# Patient Record
Sex: Male | Born: 1965 | Race: White | Hispanic: No | Marital: Married | Smoking: Never smoker
Health system: Southern US, Community
[De-identification: ages and names within clinical notes are randomized; demographics above are authoritative.]

## PROBLEM LIST (undated history)

## (undated) DIAGNOSIS — G4733 Obstructive sleep apnea (adult) (pediatric): Secondary | ICD-10-CM

## (undated) DIAGNOSIS — E781 Pure hyperglyceridemia: Secondary | ICD-10-CM

## (undated) DIAGNOSIS — I4891 Unspecified atrial fibrillation: Secondary | ICD-10-CM

## (undated) DIAGNOSIS — F419 Anxiety disorder, unspecified: Secondary | ICD-10-CM

## (undated) HISTORY — DX: Unspecified atrial fibrillation: I48.91

## (undated) HISTORY — PX: OTHER SURGICAL HISTORY: SHX169

## (undated) HISTORY — DX: Pure hyperglyceridemia: E78.1

## (undated) HISTORY — DX: Obstructive sleep apnea (adult) (pediatric): G47.33

## (undated) HISTORY — DX: Anxiety disorder, unspecified: F41.9

---

## 2005-06-07 ENCOUNTER — Encounter: Admission: RE | Admit: 2005-06-07 | Discharge: 2005-06-07 | Payer: Self-pay | Admitting: Family Medicine

## 2013-08-19 ENCOUNTER — Encounter: Payer: Self-pay | Admitting: *Deleted

## 2013-08-19 ENCOUNTER — Encounter: Payer: Self-pay | Admitting: Cardiology

## 2013-08-19 DIAGNOSIS — E781 Pure hyperglyceridemia: Secondary | ICD-10-CM | POA: Insufficient documentation

## 2013-08-19 DIAGNOSIS — F419 Anxiety disorder, unspecified: Secondary | ICD-10-CM | POA: Insufficient documentation

## 2013-08-19 DIAGNOSIS — G4733 Obstructive sleep apnea (adult) (pediatric): Secondary | ICD-10-CM | POA: Insufficient documentation

## 2013-08-19 DIAGNOSIS — I4891 Unspecified atrial fibrillation: Secondary | ICD-10-CM | POA: Insufficient documentation

## 2013-08-20 ENCOUNTER — Encounter: Payer: Self-pay | Admitting: Cardiology

## 2013-08-20 ENCOUNTER — Ambulatory Visit (INDEPENDENT_AMBULATORY_CARE_PROVIDER_SITE_OTHER): Payer: BC Managed Care – PPO | Admitting: Cardiology

## 2013-08-20 ENCOUNTER — Other Ambulatory Visit: Payer: Self-pay | Admitting: Cardiology

## 2013-08-20 ENCOUNTER — Encounter (INDEPENDENT_AMBULATORY_CARE_PROVIDER_SITE_OTHER): Payer: Self-pay

## 2013-08-20 VITALS — BP 147/84 | HR 72 | Ht 76.0 in | Wt 237.0 lb

## 2013-08-20 DIAGNOSIS — G4733 Obstructive sleep apnea (adult) (pediatric): Secondary | ICD-10-CM

## 2013-08-20 DIAGNOSIS — I4891 Unspecified atrial fibrillation: Secondary | ICD-10-CM

## 2013-08-20 DIAGNOSIS — E781 Pure hyperglyceridemia: Secondary | ICD-10-CM

## 2013-08-20 NOTE — Progress Notes (Signed)
      [address], [address] [place], [place]  [address] Phone: [phone] Fax:  [phone]  Date:  08/20/2013   ID:  Allen Adams, DOB Jan 30, 1966, MRN [id number]  PCP:  Cala Bradford, MD   History of Present Illness: Allen Adams is a 47 y.o. male with paroxysmal atrial fibrillation discovered February 2013. His symptoms were "feeling as though his heart going to 100 miles an hour. "  Thyroid function were normal. He's had a few palpitations at night but no recent episodes. Nondiabetic.   Wt Readings from Last 3 Encounters:  08/20/13 237 lb (107.502 kg)     Past Medical History  Diagnosis Date  . Anxiety   . OSA (obstructive sleep apnea)     (SPLIT 03/05/11, ESS 6, AHI 41/hr REM 90/hr, RDI 83/hr REM 120/hr, O2 nadir 83%; CPAP 9 with AHI 0/hr), Dr Earl Gala  . Hypertriglyceridemia   . Atrial fibrillation     Past Surgical History  Procedure Laterality Date  . Lumbar back surgery    . Lasik like eye surgery, ppk      Current Outpatient Prescriptions  Medication Sig Dispense Refill  . diltiazem (CARDIZEM CD) 120 MG 24 hr capsule Take 120 mg by mouth daily.       . fenofibrate 160 MG tablet Take 160 mg by mouth daily.        No current facility-administered medications for this visit.    Allergies:   Not on File  Social History:  The patient     ROS:  Please see the history of present illness.   No bleeding, no syncope, no orthopnea, no PND    PHYSICAL EXAM: VS:  BP 147/84  Ht 6\' 4"  (1.93 m)  Wt 237 lb (107.502 kg)  BMI 28.86 kg/m2 Well nourished, well developed, in no acute distress HEENT: normal Neck: no JVD Cardiac:  normal S1, S2; RRR; no murmur Lungs:  clear to auscultation bilaterally, no wheezing, rhonchi or rales Abd: soft, nontender, no hepatomegaly Ext: no edema Skin: warm and dry Neuro: no focal abnormalities noted  EKG:  08/20/13-normal sinus rhythm, nonspecific T-wave changes heart rate 71.    Echocardiogram: 12/22/11-normal EF  normal LA size  ASSESSMENT AND PLAN:  1. Paroxysmal atrial fibrillation- doing very well. No recent episodes. No change in medications. I would like her to stay on diltiazem for some suppression of his atrial fibrillation. Also continue with treatment of his obstructive sleep apnea. Diet, thickness important. 2. One year followup.  Signed, Donato Schultz, MD [facility]  08/20/2013 3:05 PM

## 2013-08-20 NOTE — Patient Instructions (Signed)
Your physician wants you to follow-up in: 1 year with Dr. Skains You will receive a reminder letter in the mail two months in advance. If you don't receive a letter, please call our office to schedule the follow-up appointment.  Your physician recommends that you continue on your current medications as directed. Please refer to the Current Medication list given to you today.  

## 2013-09-17 ENCOUNTER — Other Ambulatory Visit: Payer: Self-pay | Admitting: Family Medicine

## 2013-09-17 DIAGNOSIS — R7989 Other specified abnormal findings of blood chemistry: Secondary | ICD-10-CM

## 2013-09-18 ENCOUNTER — Ambulatory Visit
Admission: RE | Admit: 2013-09-18 | Discharge: 2013-09-18 | Disposition: A | Discharge status: Home / Self Care | Payer: BC Managed Care – PPO | Source: Ambulatory Visit | Attending: Family Medicine | Admitting: Family Medicine

## 2013-09-18 DIAGNOSIS — R7989 Other specified abnormal findings of blood chemistry: Secondary | ICD-10-CM

## 2013-11-16 ENCOUNTER — Other Ambulatory Visit: Payer: Self-pay | Admitting: *Deleted

## 2013-11-16 MED ORDER — DILTIAZEM HCL ER COATED BEADS 120 MG PO CP24: 120.0000 mg | ORAL_CAPSULE | Freq: Every day | ORAL | Status: DC

## 2014-02-22 ENCOUNTER — Encounter: Payer: Self-pay | Admitting: Cardiology

## 2014-02-25 ENCOUNTER — Encounter: Payer: Self-pay | Admitting: Cardiology

## 2014-05-20 ENCOUNTER — Other Ambulatory Visit: Payer: Self-pay

## 2014-05-20 MED ORDER — DILTIAZEM HCL ER COATED BEADS 120 MG PO CP24: 120.0000 mg | ORAL_CAPSULE | Freq: Every day | ORAL | Status: DC

## 2014-07-27 IMAGING — US US RENAL
1 series · 14 of 25 positions shown · non-contrast
Comparison: CT 04/23/2013.

CLINICAL DATA: History of elevation of creatinine level.

EXAM:
RENAL/URINARY TRACT ULTRASOUND COMPLETE

[Series 1: us renal · 0.30mm/px · 14 of 28 slices shown]
[im 1/28]
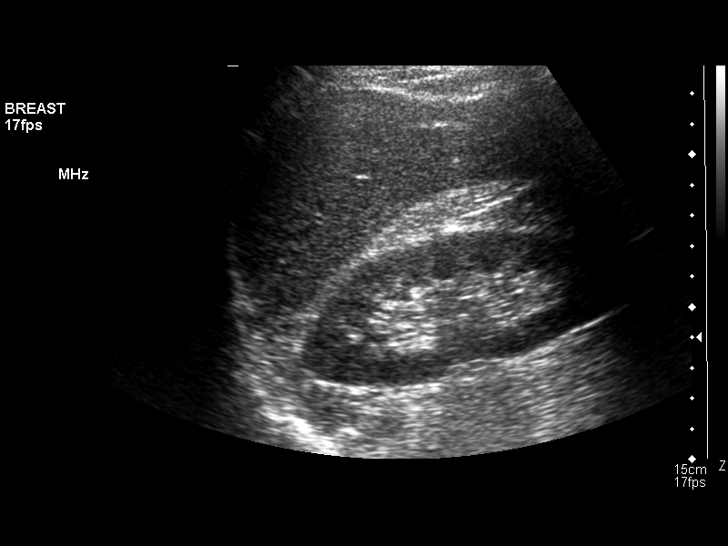
[im 3/28]
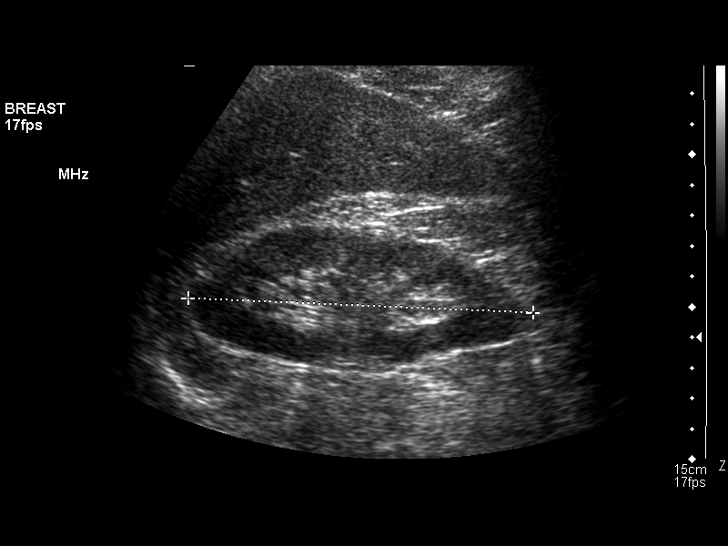
[im 5/28]
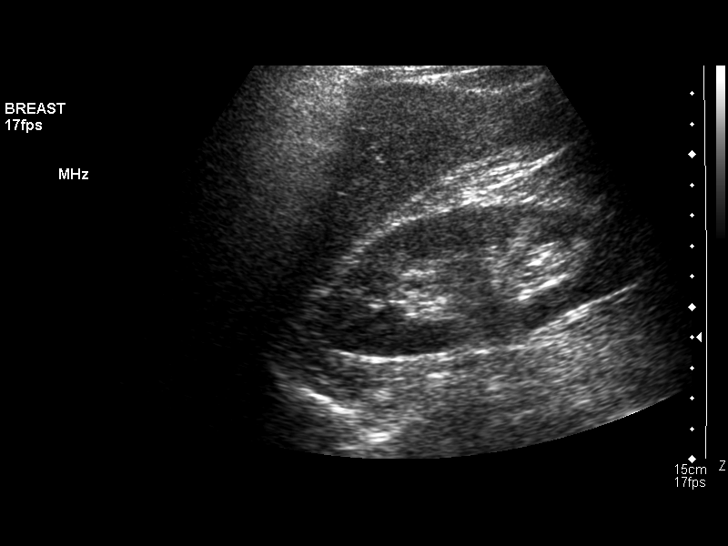
[im 7/28]
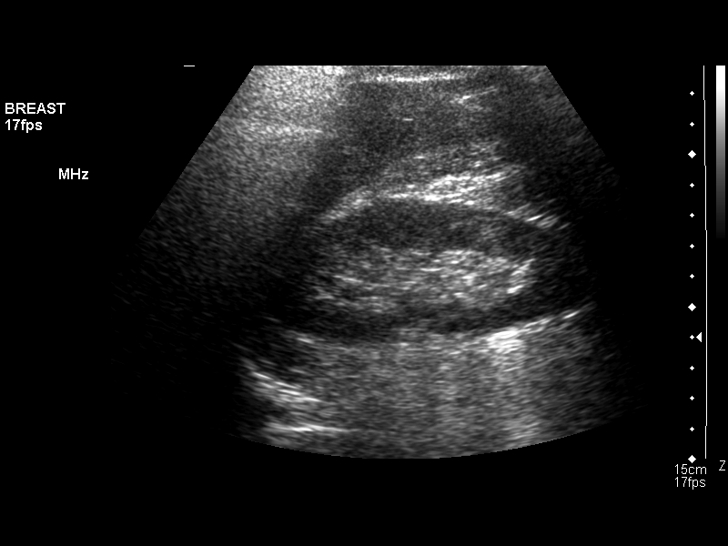
[im 10/28]
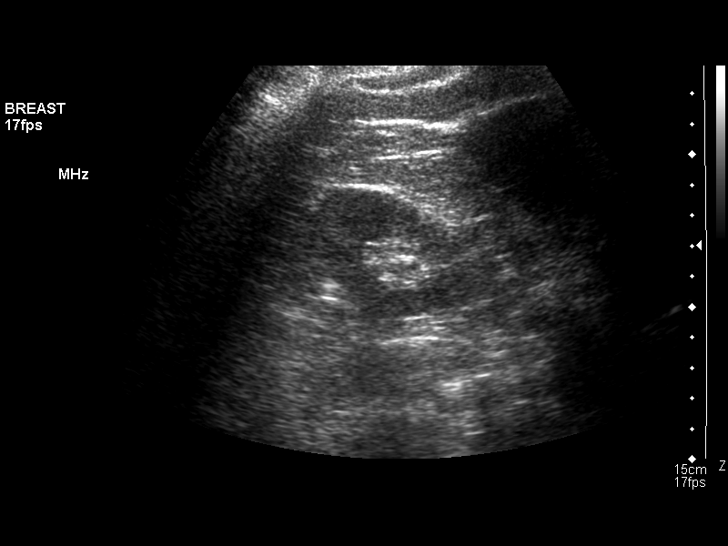
[im 11/28]
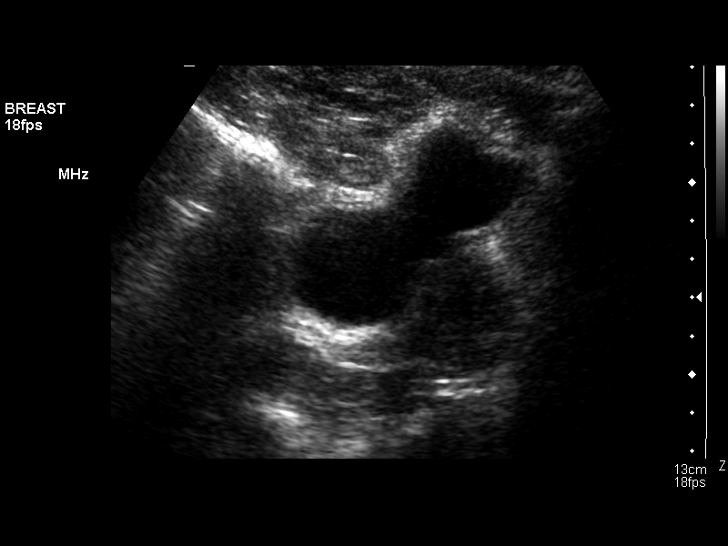
[im 13/28]
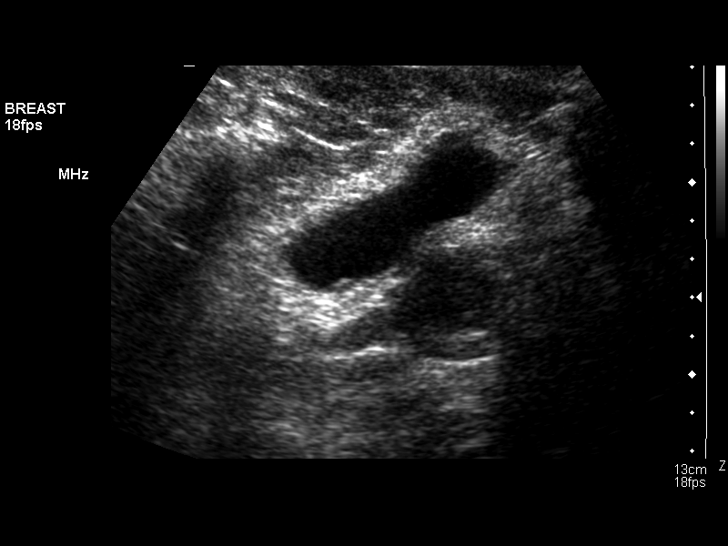
[im 15/28]
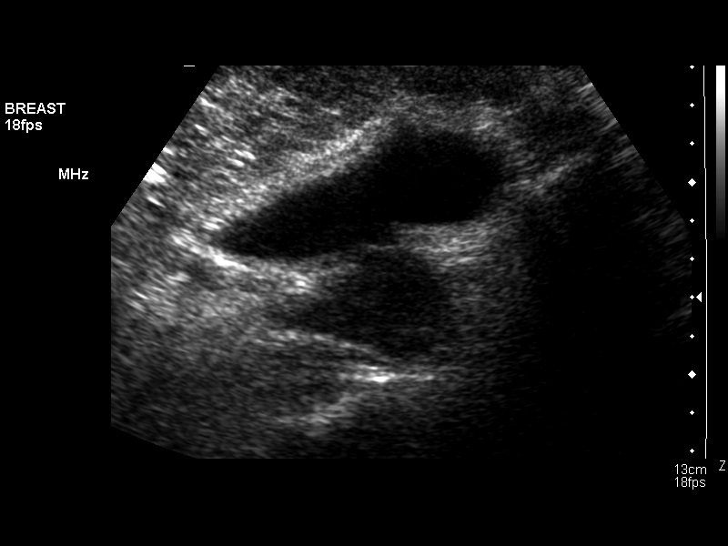
[im 17/28]
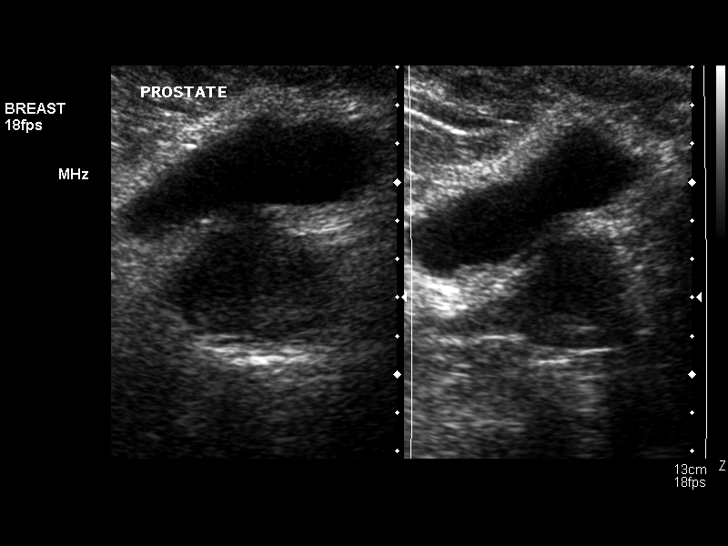
[im 19/28]
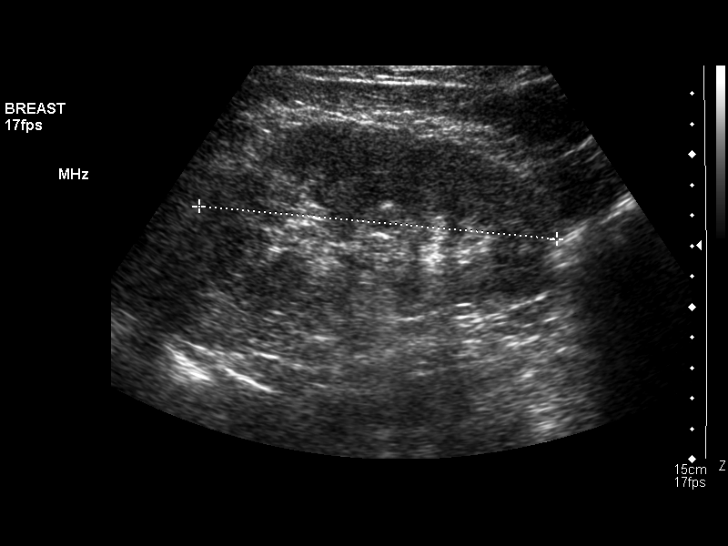
[im 21/28]
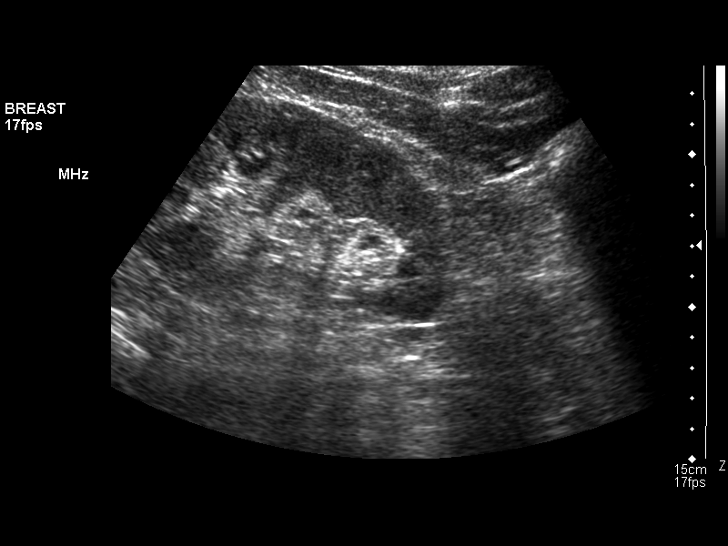
[im 23/28]
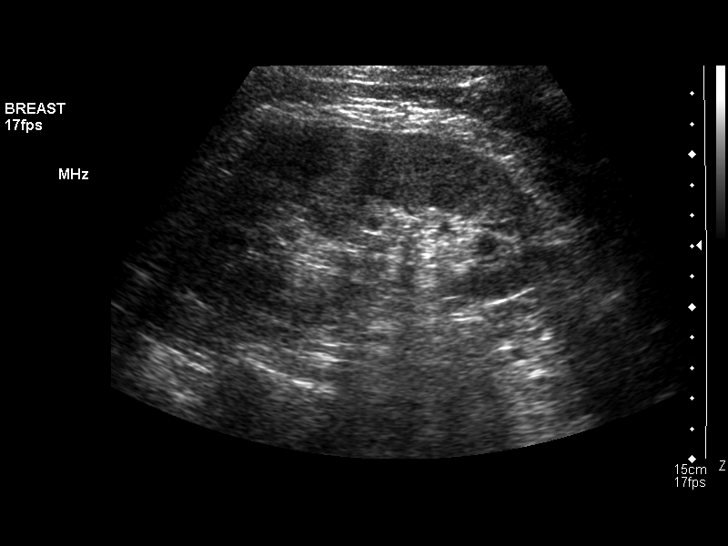
[im 25/28]
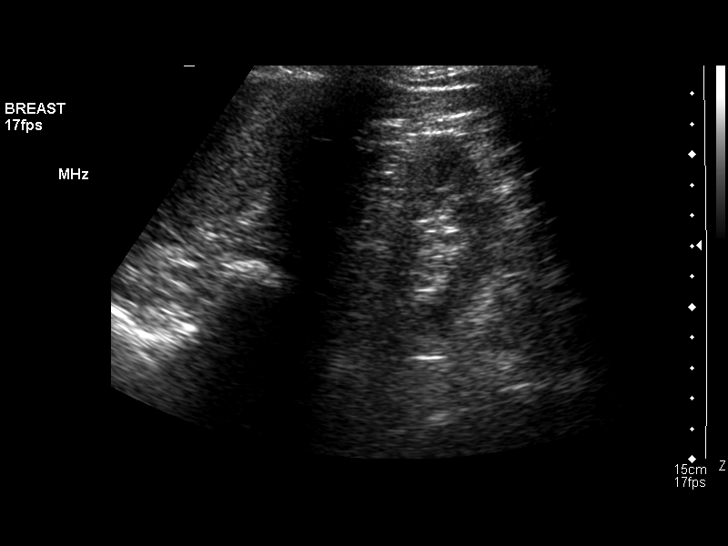
[im 28/28]
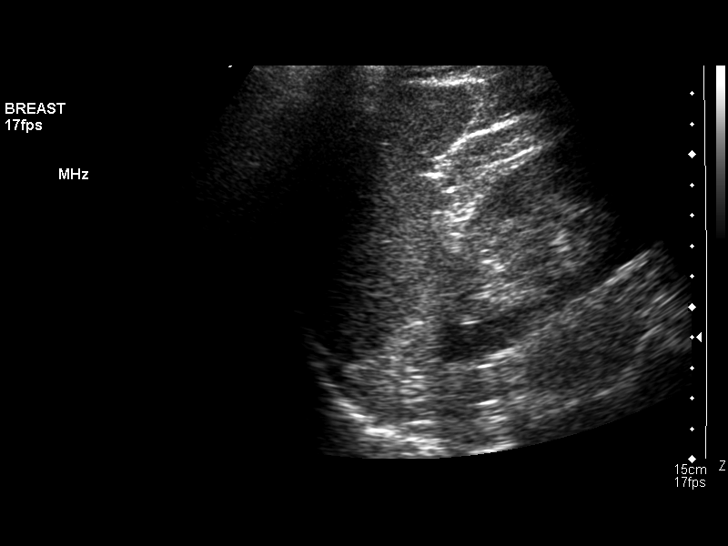

[14 of 25 positions shown; findings below may reference images not displayed]

FINDINGS: Right Kidney

Length: Right renal length is 11.3 cm. Echogenicity within normal
limits. No mass, calculus, parenchymal loss, or hydronephrosis
visualized.

Left Kidney

Length: Left renal length is 11.8 cm. Echogenicity within normal
limits. No mass, calculus, parenchymal loss, or hydronephrosis
visualized.

Bladder

No intrinsic bladder abnormality is identified. There is indentation
of the bladder base by the prostate gland. The prostate gland
measures 4.6 x 3.7 x 3.4 cm.
IMPRESSION: Normal appearance of both kidneys. No urinary bladder abnormality
evident.There is indentation of the bladder base by the prostate
gland. The prostate gland measures 4.6 x 3.7 x 3.4 cm.

## 2014-11-19 ENCOUNTER — Other Ambulatory Visit: Payer: Self-pay | Admitting: Cardiology

## 2015-02-17 ENCOUNTER — Other Ambulatory Visit: Payer: Self-pay

## 2015-02-17 MED ORDER — DILTIAZEM HCL ER COATED BEADS 120 MG PO CP24: ORAL_CAPSULE | ORAL | Status: DC

## 2015-02-18 ENCOUNTER — Other Ambulatory Visit: Payer: Self-pay

## 2015-02-18 MED ORDER — DILTIAZEM HCL ER COATED BEADS 120 MG PO CP24: ORAL_CAPSULE | ORAL | Status: DC

## 2015-04-25 ENCOUNTER — Ambulatory Visit (INDEPENDENT_AMBULATORY_CARE_PROVIDER_SITE_OTHER): Payer: BLUE CROSS/BLUE SHIELD | Admitting: Cardiology

## 2015-04-25 ENCOUNTER — Encounter: Payer: Self-pay | Admitting: Cardiology

## 2015-04-25 VITALS — BP 120/75 | HR 69 | Ht 76.0 in | Wt 236.0 lb

## 2015-04-25 DIAGNOSIS — E781 Pure hyperglyceridemia: Secondary | ICD-10-CM | POA: Diagnosis not present

## 2015-04-25 DIAGNOSIS — G4733 Obstructive sleep apnea (adult) (pediatric): Secondary | ICD-10-CM | POA: Diagnosis not present

## 2015-04-25 DIAGNOSIS — I480 Paroxysmal atrial fibrillation: Secondary | ICD-10-CM

## 2015-04-25 NOTE — Patient Instructions (Signed)
Medication Instructions:  1. STOP DILTIAZEM  Labwork: NONE  Testing/Procedures: NONE  Follow-Up: 1 YEAR FOLLOW UP WITH DR. Marlou Porch  Any Other Special Instructions Will Be Listed Below (If Applicable).

## 2015-04-25 NOTE — Progress Notes (Signed)
[facility], [address] [place], [place]  [address] Phone: [phone] Fax:  [phone]  Date:  04/25/2015   ID:  Allen Adams, DOB 06-Jan-1966, MRN [id number]  PCP:  Vidal Schwalbe, MD   History of Present Illness: Allen Adams is a 49 y.o. male with paroxysmal atrial fibrillation discovered February 2013. His symptoms were "feeling as though his heart going to 100 miles an hour. " When messed up finger, heart took off. Traumatic finger injury. Thyroid function were normal. No recent episodes. Nondiabetic.  CKD noted. He said that he has lost weight, trying to watch his diet.  Wt Readings from Last 3 Encounters:  04/25/15 236 lb (107.049 kg)  08/20/13 237 lb (107.502 kg)     Past Medical History  Diagnosis Date  . Anxiety   . OSA (obstructive sleep apnea)     (SPLIT 03/05/11, ESS 6, AHI 41/hr REM 90/hr, RDI 83/hr REM 120/hr, O2 nadir 83%; CPAP 9 with AHI 0/hr), Dr Maxwell Caul  . Hypertriglyceridemia   . Atrial fibrillation     Past Surgical History  Procedure Laterality Date  . Lumbar back surgery    . Lasik like eye surgery, ppk      Current Outpatient Prescriptions  Medication Sig Dispense Refill  . aspirin 81 MG tablet Take 81 mg by mouth daily.    . fenofibrate 160 MG tablet Take 160 mg by mouth daily.     . fluticasone (FLONASE) 50 MCG/ACT nasal spray Place 2 sprays into both nostrils daily.     No current facility-administered medications for this visit.    Allergies:   Not on File  Social History:  The patient  reports that he has never smoked. He does not have any smokeless tobacco history on file. [employer] employee  ROS:  Please see the history of present illness.   No bleeding, no syncope, no orthopnea, no PND    PHYSICAL EXAM: VS:  BP 120/75 mmHg  Pulse 69  Ht 6\' 4"  (1.93 m)  Wt 236 lb (107.049 kg)  BMI 28.74 kg/m2 Well nourished, well developed, in no acute distress HEENT: normal Neck: no JVD Cardiac:  normal S1, S2; RRR; no  murmur Lungs:  clear to auscultation bilaterally, no wheezing, rhonchi or rales Abd: soft, nontender, no hepatomegaly Ext: no edema Skin: warm and dry Neuro: no focal abnormalities noted  EKG:  04/25/15-sinus rhythm, nonspecific ST-T wave flattening, heart rate 69-previous EKG 08/20/13-normal sinus rhythm, nonspecific T-wave changes heart rate 71.    Echocardiogram: 12/22/11-normal EF normal LA size  ASSESSMENT AND PLAN:  1. Paroxysmal atrial fibrillation- doing very well. No recent episodes. Last episode was 2013. He was interested in possibly coming off of his diltiazem and I think that this is reasonable since he has not had an episode in quite some time. His original episode was surrounding a traumatic finger injury. If palpitations return, we will resume all ties him. He will let me know if this happens. Also, asked him to check his blood pressure periodically. If his blood pressure is over 140/90 without date diltiazem, he may need this for blood pressure control. 2.  Also continue with treatment of his obstructive sleep apnea. Diet important. 3. Hypertriglyceridemia-currently on fenofibrate by Dr. Dema Severin. No changes. 4. Chronic kidney disease-he told me about this. I do not have creatinine levels however he is trying to avoid ibuprofen. 5. One year followup.  Signed, Candee Furbish, MD [facility]  04/25/2015 4:39 PM

## 2016-02-14 DIAGNOSIS — M25522 Pain in left elbow: Secondary | ICD-10-CM | POA: Diagnosis not present

## 2016-03-26 DIAGNOSIS — G4733 Obstructive sleep apnea (adult) (pediatric): Secondary | ICD-10-CM | POA: Diagnosis not present

## 2016-03-26 DIAGNOSIS — R0689 Other abnormalities of breathing: Secondary | ICD-10-CM | POA: Diagnosis not present

## 2016-04-12 DIAGNOSIS — D631 Anemia in chronic kidney disease: Secondary | ICD-10-CM | POA: Diagnosis not present

## 2016-04-12 DIAGNOSIS — I129 Hypertensive chronic kidney disease with stage 1 through stage 4 chronic kidney disease, or unspecified chronic kidney disease: Secondary | ICD-10-CM | POA: Diagnosis not present

## 2016-04-12 DIAGNOSIS — N2581 Secondary hyperparathyroidism of renal origin: Secondary | ICD-10-CM | POA: Diagnosis not present

## 2016-04-12 DIAGNOSIS — N183 Chronic kidney disease, stage 3 (moderate): Secondary | ICD-10-CM | POA: Diagnosis not present

## 2016-05-29 DIAGNOSIS — R1032 Left lower quadrant pain: Secondary | ICD-10-CM | POA: Diagnosis not present

## 2016-07-26 DIAGNOSIS — G4733 Obstructive sleep apnea (adult) (pediatric): Secondary | ICD-10-CM | POA: Diagnosis not present

## 2016-09-15 DIAGNOSIS — H21233 Degeneration of iris (pigmentary), bilateral: Secondary | ICD-10-CM | POA: Diagnosis not present

## 2016-09-15 DIAGNOSIS — H52223 Regular astigmatism, bilateral: Secondary | ICD-10-CM | POA: Diagnosis not present

## 2016-11-02 DIAGNOSIS — E785 Hyperlipidemia, unspecified: Secondary | ICD-10-CM | POA: Diagnosis not present

## 2016-11-02 DIAGNOSIS — Z0000 Encounter for general adult medical examination without abnormal findings: Secondary | ICD-10-CM | POA: Diagnosis not present

## 2016-12-31 DIAGNOSIS — E785 Hyperlipidemia, unspecified: Secondary | ICD-10-CM | POA: Diagnosis not present

## 2017-01-07 DIAGNOSIS — G4733 Obstructive sleep apnea (adult) (pediatric): Secondary | ICD-10-CM | POA: Diagnosis not present

## 2017-01-07 DIAGNOSIS — R0689 Other abnormalities of breathing: Secondary | ICD-10-CM | POA: Diagnosis not present

## 2017-02-18 DIAGNOSIS — K648 Other hemorrhoids: Secondary | ICD-10-CM | POA: Diagnosis not present

## 2017-02-18 DIAGNOSIS — K5730 Diverticulosis of large intestine without perforation or abscess without bleeding: Secondary | ICD-10-CM | POA: Diagnosis not present

## 2017-02-18 DIAGNOSIS — Z1211 Encounter for screening for malignant neoplasm of colon: Secondary | ICD-10-CM | POA: Diagnosis not present

## 2017-05-23 DIAGNOSIS — R079 Chest pain, unspecified: Secondary | ICD-10-CM | POA: Diagnosis not present

## 2017-07-11 DIAGNOSIS — I129 Hypertensive chronic kidney disease with stage 1 through stage 4 chronic kidney disease, or unspecified chronic kidney disease: Secondary | ICD-10-CM | POA: Diagnosis not present

## 2017-07-11 DIAGNOSIS — D631 Anemia in chronic kidney disease: Secondary | ICD-10-CM | POA: Diagnosis not present

## 2017-07-11 DIAGNOSIS — N183 Chronic kidney disease, stage 3 (moderate): Secondary | ICD-10-CM | POA: Diagnosis not present

## 2017-07-11 DIAGNOSIS — N2581 Secondary hyperparathyroidism of renal origin: Secondary | ICD-10-CM | POA: Diagnosis not present

## 2017-07-21 DIAGNOSIS — G4733 Obstructive sleep apnea (adult) (pediatric): Secondary | ICD-10-CM | POA: Diagnosis not present

## 2017-07-29 DIAGNOSIS — M545 Low back pain: Secondary | ICD-10-CM | POA: Diagnosis not present

## 2017-10-13 DIAGNOSIS — R0689 Other abnormalities of breathing: Secondary | ICD-10-CM | POA: Diagnosis not present

## 2017-10-13 DIAGNOSIS — G4733 Obstructive sleep apnea (adult) (pediatric): Secondary | ICD-10-CM | POA: Diagnosis not present

## 2017-11-03 DIAGNOSIS — Z125 Encounter for screening for malignant neoplasm of prostate: Secondary | ICD-10-CM | POA: Diagnosis not present

## 2017-11-03 DIAGNOSIS — Z0000 Encounter for general adult medical examination without abnormal findings: Secondary | ICD-10-CM | POA: Diagnosis not present

## 2017-11-03 DIAGNOSIS — E785 Hyperlipidemia, unspecified: Secondary | ICD-10-CM | POA: Diagnosis not present

## 2017-12-09 DIAGNOSIS — S76312A Strain of muscle, fascia and tendon of the posterior muscle group at thigh level, left thigh, initial encounter: Secondary | ICD-10-CM | POA: Diagnosis not present

## 2017-12-09 DIAGNOSIS — S60419A Abrasion of unspecified finger, initial encounter: Secondary | ICD-10-CM | POA: Diagnosis not present

## 2017-12-09 DIAGNOSIS — S62306A Unspecified fracture of fifth metacarpal bone, right hand, initial encounter for closed fracture: Secondary | ICD-10-CM | POA: Diagnosis not present

## 2017-12-13 DIAGNOSIS — S62337A Displaced fracture of neck of fifth metacarpal bone, left hand, initial encounter for closed fracture: Secondary | ICD-10-CM | POA: Diagnosis not present

## 2017-12-29 DIAGNOSIS — S62337A Displaced fracture of neck of fifth metacarpal bone, left hand, initial encounter for closed fracture: Secondary | ICD-10-CM | POA: Diagnosis not present

## 2018-01-26 DIAGNOSIS — S62337A Displaced fracture of neck of fifth metacarpal bone, left hand, initial encounter for closed fracture: Secondary | ICD-10-CM | POA: Diagnosis not present

## 2018-02-02 DIAGNOSIS — E785 Hyperlipidemia, unspecified: Secondary | ICD-10-CM | POA: Diagnosis not present

## 2018-02-02 DIAGNOSIS — R972 Elevated prostate specific antigen [PSA]: Secondary | ICD-10-CM | POA: Diagnosis not present

## 2018-02-28 DIAGNOSIS — S62337A Displaced fracture of neck of fifth metacarpal bone, left hand, initial encounter for closed fracture: Secondary | ICD-10-CM | POA: Diagnosis not present

## 2018-07-17 DIAGNOSIS — N183 Chronic kidney disease, stage 3 (moderate): Secondary | ICD-10-CM | POA: Diagnosis not present

## 2018-07-17 DIAGNOSIS — D631 Anemia in chronic kidney disease: Secondary | ICD-10-CM | POA: Diagnosis not present

## 2018-07-17 DIAGNOSIS — I129 Hypertensive chronic kidney disease with stage 1 through stage 4 chronic kidney disease, or unspecified chronic kidney disease: Secondary | ICD-10-CM | POA: Diagnosis not present

## 2018-07-17 DIAGNOSIS — N2581 Secondary hyperparathyroidism of renal origin: Secondary | ICD-10-CM | POA: Diagnosis not present

## 2018-07-18 DIAGNOSIS — G4733 Obstructive sleep apnea (adult) (pediatric): Secondary | ICD-10-CM | POA: Diagnosis not present

## 2018-07-18 DIAGNOSIS — R0689 Other abnormalities of breathing: Secondary | ICD-10-CM | POA: Diagnosis not present

## 2018-08-03 DIAGNOSIS — G4733 Obstructive sleep apnea (adult) (pediatric): Secondary | ICD-10-CM | POA: Diagnosis not present

## 2018-11-08 DIAGNOSIS — N183 Chronic kidney disease, stage 3 (moderate): Secondary | ICD-10-CM | POA: Diagnosis not present

## 2018-11-08 DIAGNOSIS — E785 Hyperlipidemia, unspecified: Secondary | ICD-10-CM | POA: Diagnosis not present

## 2018-11-08 DIAGNOSIS — Z0000 Encounter for general adult medical examination without abnormal findings: Secondary | ICD-10-CM | POA: Diagnosis not present

## 2018-11-08 DIAGNOSIS — I480 Paroxysmal atrial fibrillation: Secondary | ICD-10-CM | POA: Diagnosis not present

## 2018-11-08 DIAGNOSIS — R972 Elevated prostate specific antigen [PSA]: Secondary | ICD-10-CM | POA: Diagnosis not present

## 2018-11-16 DIAGNOSIS — H40013 Open angle with borderline findings, low risk, bilateral: Secondary | ICD-10-CM | POA: Diagnosis not present

## 2018-12-08 DIAGNOSIS — L0390 Cellulitis, unspecified: Secondary | ICD-10-CM | POA: Diagnosis not present

## 2018-12-14 DIAGNOSIS — R1030 Lower abdominal pain, unspecified: Secondary | ICD-10-CM | POA: Diagnosis not present

## 2018-12-27 DIAGNOSIS — R1031 Right lower quadrant pain: Secondary | ICD-10-CM | POA: Diagnosis not present

## 2019-01-01 DIAGNOSIS — R1031 Right lower quadrant pain: Secondary | ICD-10-CM | POA: Diagnosis not present

## 2019-01-01 DIAGNOSIS — R1030 Lower abdominal pain, unspecified: Secondary | ICD-10-CM | POA: Diagnosis not present

## 2019-01-22 DIAGNOSIS — G4733 Obstructive sleep apnea (adult) (pediatric): Secondary | ICD-10-CM | POA: Diagnosis not present

## 2019-06-27 DIAGNOSIS — G4733 Obstructive sleep apnea (adult) (pediatric): Secondary | ICD-10-CM | POA: Diagnosis not present

## 2019-07-03 DIAGNOSIS — I129 Hypertensive chronic kidney disease with stage 1 through stage 4 chronic kidney disease, or unspecified chronic kidney disease: Secondary | ICD-10-CM | POA: Diagnosis not present

## 2019-07-03 DIAGNOSIS — N182 Chronic kidney disease, stage 2 (mild): Secondary | ICD-10-CM | POA: Diagnosis not present

## 2019-07-03 DIAGNOSIS — N400 Enlarged prostate without lower urinary tract symptoms: Secondary | ICD-10-CM | POA: Diagnosis not present

## 2019-09-04 DIAGNOSIS — G4733 Obstructive sleep apnea (adult) (pediatric): Secondary | ICD-10-CM | POA: Diagnosis not present

## 2019-09-25 DIAGNOSIS — G4733 Obstructive sleep apnea (adult) (pediatric): Secondary | ICD-10-CM | POA: Diagnosis not present

## 2019-11-15 DIAGNOSIS — Z125 Encounter for screening for malignant neoplasm of prostate: Secondary | ICD-10-CM | POA: Diagnosis not present

## 2019-11-15 DIAGNOSIS — Z0000 Encounter for general adult medical examination without abnormal findings: Secondary | ICD-10-CM | POA: Diagnosis not present

## 2019-11-15 DIAGNOSIS — E785 Hyperlipidemia, unspecified: Secondary | ICD-10-CM | POA: Diagnosis not present

## 2019-11-23 DIAGNOSIS — D7289 Other specified disorders of white blood cells: Secondary | ICD-10-CM | POA: Diagnosis not present

## 2019-11-30 DIAGNOSIS — D7289 Other specified disorders of white blood cells: Secondary | ICD-10-CM | POA: Diagnosis not present

## 2019-12-24 DIAGNOSIS — G4733 Obstructive sleep apnea (adult) (pediatric): Secondary | ICD-10-CM | POA: Diagnosis not present

## 2019-12-28 DIAGNOSIS — D7289 Other specified disorders of white blood cells: Secondary | ICD-10-CM | POA: Diagnosis not present

## 2020-01-11 ENCOUNTER — Telehealth: Payer: Self-pay | Admitting: Hematology and Oncology

## 2020-01-11 NOTE — Telephone Encounter (Signed)
Received a new hem referral from Dr. Dema Severin for elevated wbc. Mr Flatley returned my call and as been scheduled to see Dr. Lindi Adie on 3/25 at 345pm. Pt aware to arrive 15 minutes early.

## 2020-01-16 NOTE — Progress Notes (Signed)
[facility] NOTE  Patient Care Team: Harlan Stains, MD as PCP - General (Family Medicine)  CHIEF COMPLAINTS/PURPOSE OF CONSULTATION:  Newly diagnosed leukocytosis  HISTORY OF PRESENTING ILLNESS:  Allen Adams 54 y.o. male is here because of recent diagnosis of leukocytosis. He is referred by Dr. Dema Severin. Labs on 12/28/19 showed WBC 16.2, ANC 12.2, platelets 489. He presents to the clinic today for initial evaluation.  Patient is extremely healthy and does not have any major medical illnesses.  Denies any arthritis or skin or GI or GU related problems.  He does have hypercholesterolemia.  He is overweight.  He does not exercise regularly but he tries to walk on and off.  There has not been any new medications started on him. His leukocytosis primarily started in January 2021 and appears to have waxed and waned over the past 3 months.  Platelets have also slightly increased from the baseline.  I reviewed his records extensively and collaborated the history with the patient.  MEDICAL HISTORY:  Past Medical History:  Diagnosis Date  . Anxiety   . Atrial fibrillation   . Hypertriglyceridemia   . OSA (obstructive sleep apnea)    (SPLIT 03/05/11, ESS 6, AHI 41/hr REM 90/hr, RDI 83/hr REM 120/hr, O2 nadir 83%; CPAP 9 with AHI 0/hr), Dr Maxwell Caul    SURGICAL HISTORY: Past Surgical History:  Procedure Laterality Date  . Lasik like eye surgery, PPK    . lumbar back surgery      SOCIAL HISTORY: Social History   Socioeconomic History  . Marital status: Married    Spouse name: Not on file  . Number of children: Not on file  . Years of education: Not on file  . Highest education level: Not on file  Occupational History  . Not on file  Tobacco Use  . Smoking status: Never Smoker  Substance and Sexual Activity  . Alcohol use: Not on file  . Drug use: Not on file  . Sexual activity: Not on file  Other Topics Concern  . Not on file  Social History Narrative  .  Not on file   Social Determinants of Health   Financial Resource Strain:   . Difficulty of Paying Living Expenses:   Food Insecurity:   . Worried About Charity fundraiser in the Last Year:   . Arboriculturist in the Last Year:   Transportation Needs:   . Film/video editor (Medical):   Marland Kitchen Lack of Transportation (Non-Medical):   Physical Activity:   . Days of Exercise per Week:   . Minutes of Exercise per Session:   Stress:   . Feeling of Stress :   Social Connections:   . Frequency of Communication with Friends and Family:   . Frequency of Social Gatherings with Friends and Family:   . Attends Religious Services:   . Active Member of Clubs or Organizations:   . Attends Archivist Meetings:   Marland Kitchen Marital Status:   Intimate Partner Violence:   . Fear of Current or Ex-Partner:   . Emotionally Abused:   Marland Kitchen Physically Abused:   . Sexually Abused:     FAMILY HISTORY: Family History  Problem Relation Age of Onset  . Stroke Mother   . Cancer Father   . Heart attack Maternal Grandfather     ALLERGIES:  has no allergies on file.  MEDICATIONS:  Current Outpatient Medications  Medication Sig Dispense Refill  . aspirin 81 MG tablet Take  81 mg by mouth daily.    . fenofibrate 160 MG tablet Take 160 mg by mouth daily.     . fluticasone (FLONASE) 50 MCG/ACT nasal spray Place 2 sprays into both nostrils daily.     No current facility-administered medications for this visit.    REVIEW OF SYSTEMS:   Constitutional: Denies fevers, chills or abnormal night sweats Eyes: Denies blurriness of vision, double vision or watery eyes Ears, nose, mouth, throat, and face: Denies mucositis or sore throat Respiratory: Denies cough, dyspnea or wheezes Cardiovascular: Denies palpitation, chest discomfort or lower extremity swelling Gastrointestinal:  Denies nausea, heartburn or change in bowel habits Skin: Denies abnormal skin rashes Lymphatics: Denies new lymphadenopathy or easy  bruising Neurological:Denies numbness, tingling or new weaknesses Behavioral/Psych: Mood is stable, no new changes  All other systems were reviewed with the patient and are negative.  PHYSICAL EXAMINATION: ECOG PERFORMANCE STATUS: 1 - Symptomatic but completely ambulatory  Vitals:   01/17/20 1528  BP: 120/87  Pulse: (!) 103  Resp: 20  Temp: 98.5 F (36.9 C)  SpO2: 97%   Filed Weights   01/17/20 1528  Weight: 241 lb 11.2 oz (109.6 kg)    GENERAL:alert, no distress and comfortable SKIN: skin color, texture, turgor are normal, no rashes or significant lesions EYES: normal, conjunctiva are pink and non-injected, sclera clear OROPHARYNX:no exudate, no erythema and lips, buccal mucosa, and tongue normal  NECK: supple, thyroid normal size, non-tender, without nodularity LYMPH:  no palpable lymphadenopathy in the cervical, axillary or inguinal LUNGS: clear to auscultation and percussion with normal breathing effort HEART: regular rate & rhythm and no murmurs and no lower extremity edema ABDOMEN:abdomen soft, non-tender and normal bowel sounds Musculoskeletal:no cyanosis of digits and no clubbing  PSYCH: alert & oriented x 3 with fluent speech NEURO: no focal motor/sensory deficits  LABORATORY DATA:  I have reviewed the data as listed No results found for: WBC, HGB, HCT, MCV, PLT No results found for: NA, K, CL, CO2  RADIOGRAPHIC STUDIES: I have personally reviewed the radiological reports and agreed with the findings in the report.  ASSESSMENT AND PLAN:  Leukocytosis Labs on 12/28/19 showed WBC 16.2, ANC 12.2, platelets 489 Differential diagnosis of mild leukocytosis (WBC count 15.5, ANC 12.9 on 06/04/2016 Previous blood work review revealed that patient had chronic leukocytosis, 8 years ago patient had a white count of 14.4 with ANC of 10K  Differential diagnosis of chronic neutrophilia 1. Inflammations: The only probable inflammation is gastritis 2. infections: Unlikely in  his situation 3. Medications: Patient does not take any steroids 4. Bone marrow disorders  Workup: 1. CBC with differential to evaluate the peripheral smear  2. CRP 3.  Ultrasound of the abdomen to look for fatty liver.  Reduction of inflammation: I discussed with the patient that exercising regularly and eating fruits and vegetables and less red meat would decrease inflammation.  We discussed the role of vitamin D as well.  Return to clinic in 2 weeks to discuss the test results.    All questions were answered. The patient knows to call the clinic with any problems, questions or concerns.   Rulon Eisenmenger, MD, MPH 01/17/2020    I, Molly Dorshimer, am acting as scribe for Nicholas Lose, MD.  I have reviewed the above documentation for accuracy and completeness, and I agree with the above.

## 2020-01-17 ENCOUNTER — Inpatient Hospital Stay: Payer: BC Managed Care – PPO | Attending: Hematology and Oncology | Admitting: Hematology and Oncology

## 2020-01-17 ENCOUNTER — Inpatient Hospital Stay: Payer: BC Managed Care – PPO

## 2020-01-17 ENCOUNTER — Other Ambulatory Visit: Payer: BC Managed Care – PPO

## 2020-01-17 ENCOUNTER — Other Ambulatory Visit: Payer: Self-pay

## 2020-01-17 DIAGNOSIS — Z809 Family history of malignant neoplasm, unspecified: Secondary | ICD-10-CM | POA: Diagnosis not present

## 2020-01-17 DIAGNOSIS — Z8249 Family history of ischemic heart disease and other diseases of the circulatory system: Secondary | ICD-10-CM

## 2020-01-17 DIAGNOSIS — E7800 Pure hypercholesterolemia, unspecified: Secondary | ICD-10-CM | POA: Insufficient documentation

## 2020-01-17 DIAGNOSIS — D72828 Other elevated white blood cell count: Secondary | ICD-10-CM

## 2020-01-17 DIAGNOSIS — Z823 Family history of stroke: Secondary | ICD-10-CM | POA: Insufficient documentation

## 2020-01-17 DIAGNOSIS — D72829 Elevated white blood cell count, unspecified: Secondary | ICD-10-CM | POA: Insufficient documentation

## 2020-01-17 LAB — CBC WITH DIFFERENTIAL (CANCER CENTER ONLY)
Abs Immature Granulocytes: 2.92 10*3/uL — ABNORMAL HIGH (ref 0.00–0.07)
Basophils Absolute: 1.9 10*3/uL — ABNORMAL HIGH (ref 0.0–0.1)
Basophils Relative: 10 %
Eosinophils Absolute: 0.1 10*3/uL (ref 0.0–0.5)
Eosinophils Relative: 1 %
HCT: 49.5 % (ref 39.0–52.0)
Hemoglobin: 16.2 g/dL (ref 13.0–17.0)
Immature Granulocytes: 16 %
Lymphocytes Relative: 9 %
Lymphs Abs: 1.7 10*3/uL (ref 0.7–4.0)
MCH: 28.4 pg (ref 26.0–34.0)
MCHC: 32.7 g/dL (ref 30.0–36.0)
MCV: 86.7 fL (ref 80.0–100.0)
Monocytes Absolute: 0.6 10*3/uL (ref 0.1–1.0)
Monocytes Relative: 3 %
Neutro Abs: 11.5 10*3/uL — ABNORMAL HIGH (ref 1.7–7.7)
Neutrophils Relative %: 61 %
Platelet Count: 537 10*3/uL — ABNORMAL HIGH (ref 150–400)
RBC: 5.71 MIL/uL (ref 4.22–5.81)
RDW: 14.8 % (ref 11.5–15.5)
WBC Count: 18.9 10*3/uL — ABNORMAL HIGH (ref 4.0–10.5)
nRBC: 0 % (ref 0.0–0.2)

## 2020-01-17 LAB — CMP (CANCER CENTER ONLY)
ALT: 18 U/L (ref 0–44)
AST: 17 U/L (ref 15–41)
Albumin: 4.3 g/dL (ref 3.5–5.0)
Alkaline Phosphatase: 89 U/L (ref 38–126)
Anion gap: 10 (ref 5–15)
BUN: 17 mg/dL (ref 6–20)
CO2: 24 mmol/L (ref 22–32)
Calcium: 8.9 mg/dL (ref 8.9–10.3)
Chloride: 106 mmol/L (ref 98–111)
Creatinine: 1.31 mg/dL — ABNORMAL HIGH (ref 0.61–1.24)
GFR, Est AFR Am: >60 mL/min (ref >60)
GFR, Estimated: >60 mL/min (ref >60)
Glucose, Bld: 105 mg/dL — ABNORMAL HIGH (ref 70–99)
Potassium: 4.1 mmol/L (ref 3.5–5.1)
Sodium: 140 mmol/L (ref 135–145)
Total Bilirubin: 0.5 mg/dL (ref 0.3–1.2)
Total Protein: 6.6 g/dL (ref 6.5–8.1)

## 2020-01-17 LAB — C-REACTIVE PROTEIN: CRP: 1.8 mg/dL — ABNORMAL HIGH (ref <1.0)

## 2020-01-17 NOTE — Assessment & Plan Note (Signed)
Labs on 12/28/19 showed WBC 16.2, ANC 12.2, platelets 489 Differential diagnosis of mild leukocytosis (WBC count 15.5, ANC 12.9 on 06/04/2016 Previous blood work review revealed that patient had chronic leukocytosis, 8 years ago patient had a white count of 14.4 with ANC of 10K  Differential diagnosis of chronic neutrophilia 1. Inflammations: The only probable inflammation is gastritis 2. infections: Unlikely in her situation 3. Medications: Patient does not take any steroids 4. Bone marrow disorders  Workup: 1. CBC with differential to evaluate the peripheral smear  2. flow cytometry for leukemia 3. BCR-ABL and JAK-2 mutation testing 4. CRP  If all the above tests are normal, we can see the patient on an as-needed basis. Return to clinic in 2 weeks to discuss the test results.

## 2020-01-18 ENCOUNTER — Other Ambulatory Visit: Payer: Self-pay | Admitting: Hematology and Oncology

## 2020-01-18 DIAGNOSIS — D72828 Other elevated white blood cell count: Secondary | ICD-10-CM

## 2020-01-18 NOTE — Progress Notes (Signed)
I called the patient and left a message for him to come and get additional labs done. His white blood cell count continues to be elevated with neutrophils and left shift. I suspect that it is all related to underlying inflammation.  His CRP was also up. However on the differential there were some myelocytes and metamyelocytes. Therefore I would like to get flow cytometry and FISH panel for BCR-ABL for CML. I would also like to get MPN panel.

## 2020-01-24 ENCOUNTER — Ambulatory Visit (HOSPITAL_COMMUNITY)
Admission: RE | Admit: 2020-01-24 | Discharge: 2020-01-24 | Disposition: A | Discharge status: Home / Self Care | Payer: BC Managed Care – PPO | Source: Ambulatory Visit | Attending: Hematology and Oncology | Admitting: Hematology and Oncology

## 2020-01-24 ENCOUNTER — Other Ambulatory Visit: Payer: Self-pay

## 2020-01-24 DIAGNOSIS — D72828 Other elevated white blood cell count: Secondary | ICD-10-CM | POA: Diagnosis not present

## 2020-01-24 DIAGNOSIS — K760 Fatty (change of) liver, not elsewhere classified: Secondary | ICD-10-CM | POA: Diagnosis not present

## 2020-01-24 DIAGNOSIS — K824 Cholesterolosis of gallbladder: Secondary | ICD-10-CM | POA: Diagnosis not present

## 2020-02-01 ENCOUNTER — Ambulatory Visit: Payer: BC Managed Care – PPO | Admitting: Hematology and Oncology

## 2020-02-01 ENCOUNTER — Inpatient Hospital Stay: Payer: BC Managed Care – PPO | Attending: Hematology and Oncology

## 2020-02-01 ENCOUNTER — Inpatient Hospital Stay: Payer: BC Managed Care – PPO

## 2020-02-01 ENCOUNTER — Other Ambulatory Visit: Payer: Self-pay

## 2020-02-01 DIAGNOSIS — D72828 Other elevated white blood cell count: Secondary | ICD-10-CM | POA: Diagnosis present

## 2020-02-01 DIAGNOSIS — I4891 Unspecified atrial fibrillation: Secondary | ICD-10-CM | POA: Diagnosis not present

## 2020-02-01 DIAGNOSIS — Z823 Family history of stroke: Secondary | ICD-10-CM | POA: Diagnosis not present

## 2020-02-01 DIAGNOSIS — G4733 Obstructive sleep apnea (adult) (pediatric): Secondary | ICD-10-CM | POA: Insufficient documentation

## 2020-02-01 DIAGNOSIS — K824 Cholesterolosis of gallbladder: Secondary | ICD-10-CM | POA: Insufficient documentation

## 2020-02-01 DIAGNOSIS — Z809 Family history of malignant neoplasm, unspecified: Secondary | ICD-10-CM | POA: Diagnosis not present

## 2020-02-01 DIAGNOSIS — E785 Hyperlipidemia, unspecified: Secondary | ICD-10-CM | POA: Insufficient documentation

## 2020-02-01 DIAGNOSIS — Z8249 Family history of ischemic heart disease and other diseases of the circulatory system: Secondary | ICD-10-CM | POA: Insufficient documentation

## 2020-02-01 DIAGNOSIS — K760 Fatty (change of) liver, not elsewhere classified: Secondary | ICD-10-CM | POA: Diagnosis not present

## 2020-02-01 DIAGNOSIS — Z79899 Other long term (current) drug therapy: Secondary | ICD-10-CM | POA: Diagnosis not present

## 2020-02-01 DIAGNOSIS — R197 Diarrhea, unspecified: Secondary | ICD-10-CM | POA: Insufficient documentation

## 2020-02-01 DIAGNOSIS — C9210 Chronic myeloid leukemia, BCR/ABL-positive, not having achieved remission: Secondary | ICD-10-CM | POA: Diagnosis not present

## 2020-02-01 DIAGNOSIS — D72829 Elevated white blood cell count, unspecified: Secondary | ICD-10-CM | POA: Diagnosis not present

## 2020-02-05 LAB — FLOW CYTOMETRY

## 2020-02-05 LAB — SURGICAL PATHOLOGY

## 2020-02-10 NOTE — Progress Notes (Addendum)
Patient Care Team: Harlan Stains, MD as PCP - General (Family Medicine)  DIAGNOSIS:    ICD-10-CM   1. Other elevated white blood cell (WBC) count  D72.828     CHIEF COMPLIANT: Follow-up to review labs   INTERVAL HISTORY: Allen Adams is a 54 y.o. with above-mentioned history of leukocytosis. Abdominal US on 01/24/20 showed several small polyps on the gallbladder and no other findings. Labs on 01/17/20 showed WBC 18.9, ANC 11.5, platelets 537, CRP 1.8. He presents to the clinic today to review his recent labs.   ALLERGIES:  has no allergies on file.  MEDICATIONS:  Current Outpatient Medications  Medication Sig Dispense Refill  . aspirin 81 MG tablet Take 81 mg by mouth daily.     No current facility-administered medications for this visit.    PHYSICAL EXAMINATION: ECOG PERFORMANCE STATUS: 1 - Symptomatic but completely ambulatory  Vitals:   02/11/20 1506  BP: 133/83  Pulse: 98  Resp: 17  Temp: 98 F (36.7 C)  SpO2: 97%   Filed Weights   02/11/20 1506  Weight: 235 lb 8 oz (106.8 kg)    LABORATORY DATA:  I have reviewed the data as listed CMP Latest Ref Rng & Units 01/17/2020  Glucose 70 - 99 mg/dL 105(H)  BUN 6 - 20 mg/dL 17  Creatinine 0.61 - 1.24 mg/dL 1.31(H)  Sodium 135 - 145 mmol/L 140  Potassium 3.5 - 5.1 mmol/L 4.1  Chloride 98 - 111 mmol/L 106  CO2 22 - 32 mmol/L 24  Calcium 8.9 - 10.3 mg/dL 8.9  Total Protein 6.5 - 8.1 g/dL 6.6  Total Bilirubin 0.3 - 1.2 mg/dL 0.5  Alkaline Phos 38 - 126 U/L 89  AST 15 - 41 U/L 17  ALT 0 - 44 U/L 18    Lab Results  Component Value Date   WBC 18.9 (H) 01/17/2020   HGB 16.2 01/17/2020   HCT 49.5 01/17/2020   MCV 86.7 01/17/2020   PLT 537 (H) 01/17/2020   NEUTROABS 11.5 (H) 01/17/2020    ASSESSMENT & PLAN:  Leukocytosis Labs on 12/28/19 showed WBC 16.2, ANC 12.2, platelets 489 Differential diagnosis of mild leukocytosis (WBC count 15.5, ANC 12.9 on 06/04/2016 Previous blood work review revealed that patient  had chronic leukocytosis, 8 years ago patient had a white count of 14.4 with ANC of 10K  Differential diagnosis of chronic neutrophilia 1. Inflammations: The only probable inflammation is gastritis 2. infections: Unlikely in his situation 3. Medications: Patient does not take any steroids 4. Bone marrow disorders ------------------------------------------------------------------------------------------------------------------- Flow cytometry: No B or T-cell abnormalities identified MPN panel: Negative for relevant mutations including JAK2  Ultrasound abdomen 02/11/2020: Several small polyps in the gallbladder.  Spleen is normal, liver no lesions identified (no evidence of fatty liver)  He tells me that his wife just had undergone a bone marrow biopsy and is awaiting the results.  No orders of the defined types were placed in this encounter.  The patient has a good understanding of the overall plan. he agrees with it. he will call with any problems that may develop before the next visit here.  Total time spent: 20 mins including face to face time and time spent for planning, charting and coordination of care  Nicholas Lose, MD 02/11/2020  I, Cloyde Reams Dorshimer, am acting as scribe for Dr. Nicholas Lose.  I have reviewed the above documentation for accuracy and completeness, and I agree with the above.  Addendum: After the patient left we got the results  of BCR-ABL which showed 90.67% positive nuclei for the fusion. This confirms the presence of CML. I called and left a message for the patient to call me back to discuss test results. I would like him to see our [facility] specialist in our clinic Dr. Lorenso Courier to take over his care and discuss treatment options.  I will once again reach out to him later today or tomorrow to discuss this finding.

## 2020-02-11 ENCOUNTER — Other Ambulatory Visit: Payer: Self-pay

## 2020-02-11 ENCOUNTER — Inpatient Hospital Stay (HOSPITAL_BASED_OUTPATIENT_CLINIC_OR_DEPARTMENT_OTHER): Payer: BC Managed Care – PPO | Admitting: Hematology and Oncology

## 2020-02-11 DIAGNOSIS — C9210 Chronic myeloid leukemia, BCR/ABL-positive, not having achieved remission: Secondary | ICD-10-CM | POA: Diagnosis not present

## 2020-02-11 DIAGNOSIS — K824 Cholesterolosis of gallbladder: Secondary | ICD-10-CM | POA: Diagnosis not present

## 2020-02-11 DIAGNOSIS — D72828 Other elevated white blood cell count: Secondary | ICD-10-CM | POA: Diagnosis not present

## 2020-02-11 DIAGNOSIS — I4891 Unspecified atrial fibrillation: Secondary | ICD-10-CM | POA: Diagnosis not present

## 2020-02-11 DIAGNOSIS — E785 Hyperlipidemia, unspecified: Secondary | ICD-10-CM | POA: Diagnosis not present

## 2020-02-11 DIAGNOSIS — G4733 Obstructive sleep apnea (adult) (pediatric): Secondary | ICD-10-CM | POA: Diagnosis not present

## 2020-02-11 DIAGNOSIS — Z79899 Other long term (current) drug therapy: Secondary | ICD-10-CM | POA: Diagnosis not present

## 2020-02-11 DIAGNOSIS — Z823 Family history of stroke: Secondary | ICD-10-CM | POA: Diagnosis not present

## 2020-02-11 DIAGNOSIS — Z809 Family history of malignant neoplasm, unspecified: Secondary | ICD-10-CM | POA: Diagnosis not present

## 2020-02-11 DIAGNOSIS — R197 Diarrhea, unspecified: Secondary | ICD-10-CM | POA: Diagnosis not present

## 2020-02-11 DIAGNOSIS — Z8249 Family history of ischemic heart disease and other diseases of the circulatory system: Secondary | ICD-10-CM | POA: Diagnosis not present

## 2020-02-11 DIAGNOSIS — K760 Fatty (change of) liver, not elsewhere classified: Secondary | ICD-10-CM | POA: Diagnosis not present

## 2020-02-11 NOTE — Assessment & Plan Note (Signed)
Labs on 12/28/19 showed WBC 16.2, ANC 12.2, platelets 489 Differential diagnosis of mild leukocytosis (WBC count 15.5, ANC 12.9 on 06/04/2016 Previous blood work review revealed that patient had chronic leukocytosis, 8 years ago patient had a white count of 14.4 with ANC of 10K  Differential diagnosis of chronic neutrophilia 1. Inflammations: The only probable inflammation is gastritis 2. infections: Unlikely in his situation 3. Medications: Patient does not take any steroids 4. Bone marrow disorders ------------------------------------------------------------------------------------------------------------------- Flow cytometry: No B or T-cell abnormalities identified MPN panel: Negative for relevant mutations including JAK2 BCR-ABL: Pending Ultrasound abdomen 02/11/2020: Several small polyps in the gallbladder.  Spleen is normal, liver no lesions identified (no evidence of fatty liver)  Based on the above work-up I suspect the cause of leukocytosis is simply inflammation related. No further work-up is necessary at this time. It is likely that the patient will have chronic elevation of white blood cell count. If there are any additional blood count changes happen please do not hesitate to contact us.

## 2020-02-13 ENCOUNTER — Telehealth: Payer: Self-pay | Admitting: Hematology and Oncology

## 2020-02-13 NOTE — Telephone Encounter (Signed)
Mr. Allen Adams has been cld and scheduled to see Dr. Lorenso Courier on 4/29 at 10am. Pt aware to arrive 15 minutes early.

## 2020-02-21 ENCOUNTER — Other Ambulatory Visit: Payer: Self-pay

## 2020-02-21 ENCOUNTER — Inpatient Hospital Stay: Payer: BC Managed Care – PPO

## 2020-02-21 ENCOUNTER — Inpatient Hospital Stay (HOSPITAL_BASED_OUTPATIENT_CLINIC_OR_DEPARTMENT_OTHER): Payer: BC Managed Care – PPO | Admitting: Hematology and Oncology

## 2020-02-21 VITALS — BP 134/82 | HR 94 | Temp 98.5°F | Resp 18 | Ht 76.0 in | Wt 235.2 lb

## 2020-02-21 DIAGNOSIS — C9210 Chronic myeloid leukemia, BCR/ABL-positive, not having achieved remission: Secondary | ICD-10-CM | POA: Diagnosis not present

## 2020-02-21 DIAGNOSIS — Z8249 Family history of ischemic heart disease and other diseases of the circulatory system: Secondary | ICD-10-CM | POA: Diagnosis not present

## 2020-02-21 DIAGNOSIS — E785 Hyperlipidemia, unspecified: Secondary | ICD-10-CM | POA: Diagnosis not present

## 2020-02-21 DIAGNOSIS — R197 Diarrhea, unspecified: Secondary | ICD-10-CM | POA: Diagnosis not present

## 2020-02-21 DIAGNOSIS — Z823 Family history of stroke: Secondary | ICD-10-CM | POA: Diagnosis not present

## 2020-02-21 DIAGNOSIS — I4891 Unspecified atrial fibrillation: Secondary | ICD-10-CM | POA: Diagnosis not present

## 2020-02-21 DIAGNOSIS — D72828 Other elevated white blood cell count: Secondary | ICD-10-CM | POA: Diagnosis not present

## 2020-02-21 DIAGNOSIS — D72825 Bandemia: Secondary | ICD-10-CM

## 2020-02-21 DIAGNOSIS — Z7189 Other specified counseling: Secondary | ICD-10-CM | POA: Diagnosis not present

## 2020-02-21 DIAGNOSIS — K760 Fatty (change of) liver, not elsewhere classified: Secondary | ICD-10-CM | POA: Diagnosis not present

## 2020-02-21 DIAGNOSIS — G4733 Obstructive sleep apnea (adult) (pediatric): Secondary | ICD-10-CM | POA: Diagnosis not present

## 2020-02-21 DIAGNOSIS — Z809 Family history of malignant neoplasm, unspecified: Secondary | ICD-10-CM | POA: Diagnosis not present

## 2020-02-21 DIAGNOSIS — K824 Cholesterolosis of gallbladder: Secondary | ICD-10-CM | POA: Diagnosis not present

## 2020-02-21 DIAGNOSIS — Z79899 Other long term (current) drug therapy: Secondary | ICD-10-CM | POA: Diagnosis not present

## 2020-02-21 LAB — CBC WITH DIFFERENTIAL (CANCER CENTER ONLY)
Abs Immature Granulocytes: 4.35 10*3/uL — ABNORMAL HIGH (ref 0.00–0.07)
Basophils Absolute: 2.7 10*3/uL — ABNORMAL HIGH (ref 0.0–0.1)
Basophils Relative: 13 %
Eosinophils Absolute: 0.1 10*3/uL (ref 0.0–0.5)
Eosinophils Relative: 1 %
HCT: 49.7 % (ref 39.0–52.0)
Hemoglobin: 16.2 g/dL (ref 13.0–17.0)
Immature Granulocytes: 20 %
Lymphocytes Relative: 7 %
Lymphs Abs: 1.6 10*3/uL (ref 0.7–4.0)
MCH: 28.1 pg (ref 26.0–34.0)
MCHC: 32.6 g/dL (ref 30.0–36.0)
MCV: 86.3 fL (ref 80.0–100.0)
Monocytes Absolute: 0.5 10*3/uL (ref 0.1–1.0)
Monocytes Relative: 3 %
Neutro Abs: 12.2 10*3/uL — ABNORMAL HIGH (ref 1.7–7.7)
Neutrophils Relative %: 56 %
Platelet Count: 549 10*3/uL — ABNORMAL HIGH (ref 150–400)
RBC: 5.76 MIL/uL (ref 4.22–5.81)
RDW: 15.4 % (ref 11.5–15.5)
WBC Count: 21.6 10*3/uL — ABNORMAL HIGH (ref 4.0–10.5)
nRBC: 0 % (ref 0.0–0.2)

## 2020-02-21 LAB — CMP (CANCER CENTER ONLY)
ALT: 22 U/L (ref 0–44)
AST: 18 U/L (ref 15–41)
Albumin: 4.4 g/dL (ref 3.5–5.0)
Alkaline Phosphatase: 89 U/L (ref 38–126)
Anion gap: 9 (ref 5–15)
BUN: 18 mg/dL (ref 6–20)
CO2: 24 mmol/L (ref 22–32)
Calcium: 9.2 mg/dL (ref 8.9–10.3)
Chloride: 108 mmol/L (ref 98–111)
Creatinine: 1.3 mg/dL — ABNORMAL HIGH (ref 0.61–1.24)
GFR, Est AFR Am: >60 mL/min (ref >60)
GFR, Estimated: >60 mL/min (ref >60)
Glucose, Bld: 108 mg/dL — ABNORMAL HIGH (ref 70–99)
Potassium: 4.2 mmol/L (ref 3.5–5.1)
Sodium: 141 mmol/L (ref 135–145)
Total Bilirubin: 0.6 mg/dL (ref 0.3–1.2)
Total Protein: 6.8 g/dL (ref 6.5–8.1)

## 2020-02-21 LAB — SAVE SMEAR(SSMR), FOR PROVIDER SLIDE REVIEW

## 2020-02-21 LAB — LACTATE DEHYDROGENASE: LDH: 322 U/L — ABNORMAL HIGH (ref 98–192)

## 2020-02-21 LAB — HEPATITIS B SURFACE ANTIBODY,QUALITATIVE: Hep B S Ab: NONREACTIVE

## 2020-02-21 LAB — HEPATITIS B SURFACE ANTIGEN: Hepatitis B Surface Ag: NONREACTIVE

## 2020-02-21 LAB — HEPATITIS B CORE ANTIBODY, TOTAL: Hep B Core Total Ab: NONREACTIVE

## 2020-02-21 MED ORDER — DASATINIB 100 MG PO TABS
100.0000 mg | ORAL_TABLET | Freq: Every day | ORAL | 3 refills | Status: DC
Start: 2020-02-21 — End: 2020-02-25

## 2020-02-21 NOTE — Progress Notes (Signed)
[place] [phone]   [phone]  INITIAL CONSULT NOTE: Transfer of Providers  Patient Care Team: Harlan Stains, MD as PCP - General (Family Medicine)  Hematological/Oncological History # Chronic Myeloid Leukemia, Chronic Phase 1) 06/04/2016: WBC count 15.5, ANC 12.9 2) 01/17/2020: WBC 18.9, Hgb 16.2, Plt 537, MCV 86.7 3) 02/01/2020: establish care with Dr. Lindi Adie. Flow cytometry showed no abnormalities. JAK2 negative. BCR/ABL PCR showed 90.67% positive nuclei for the fusion. Diagnosed with CML 4) 02/21/2020: establish care with Dr. Lorenso Courier   CHIEF COMPLAINTS/PURPOSE OF CONSULTATION:  "Chronic Myeloid Leukemia "  HISTORY OF PRESENTING ILLNESS:  Allen Adams 54 y.o. male with medical history significant for atrial fibrillation, HLD, and OSA who presents for evaluation of newly diagnosed CML.   On review of the previous records Allen Adams established with Dr. Ladona Mow on 02/01/2020.  At that time he evaluated for longstanding leukocytosis.  As part of his evaluation he underwent flow cytometry which showed no abnormalities.  He underwent JAK2 panel with cascade which showed no additional abnormalities.  He had a BCR/ABL PCR which showed 90.67% positive nuclei for the fusion.  He was given a tentative diagnosis of CML and referred to Dr. Lorenso Courier for further evaluation and management.  On exam today Allen Adams notes that he feels quite well.  He notes that he has not been having any issues with weight loss, fevers, chills, sweats, nausea, vomiting or diarrhea.  He does note that he has some residual tenderness from a hernia surgery site on his right side.  He is concerned that this may be the source of inflammation driving up his white blood cell count.  On discussion about his atrial fibrillation the patient notes that it is not particularly troublesome and that he does not have to take any routine medication for it.  He also reports that he does not smoke, does  not drink, and does not use any recreational drugs.  He reports that overall he feels healthy and was unsure why his white blood cell count remained chronically elevated.  A full 10 point ROS is listed below.  Today we had a detailed discussion of the TKI therapies available for chronic myeloid leukemia.  The discussion of these side effects and risks and benefits are discussed below in the assessment and plan.  MEDICAL HISTORY:  Past Medical History:  Diagnosis Date  . Anxiety   . Atrial fibrillation   . Hypertriglyceridemia   . OSA (obstructive sleep apnea)    (SPLIT 03/05/11, ESS 6, AHI 41/hr REM 90/hr, RDI 83/hr REM 120/hr, O2 nadir 83%; CPAP 9 with AHI 0/hr), Dr Maxwell Caul    SURGICAL HISTORY: Past Surgical History:  Procedure Laterality Date  . Lasik like eye surgery, PPK    . lumbar back surgery      SOCIAL HISTORY: Social History   Socioeconomic History  . Marital status: Married    Spouse name: Not on file  . Number of children: Not on file  . Years of education: Not on file  . Highest education level: Not on file  Occupational History  . Not on file  Tobacco Use  . Smoking status: Never Smoker  Substance and Sexual Activity  . Alcohol use: Not on file  . Drug use: Not on file  . Sexual activity: Not on file  Other Topics Concern  . Not on file  Social History Narrative  . Not on file   Social Determinants of Health   Financial Resource Strain:   .  Difficulty of Paying Living Expenses:   Food Insecurity:   . Worried About Charity fundraiser in the Last Year:   . Arboriculturist in the Last Year:   Transportation Needs:   . Film/video editor (Medical):   Marland Kitchen Lack of Transportation (Non-Medical):   Physical Activity:   . Days of Exercise per Week:   . Minutes of Exercise per Session:   Stress:   . Feeling of Stress :   Social Connections:   . Frequency of Communication with Friends and Family:   . Frequency of Social Gatherings with Friends and  Family:   . Attends Religious Services:   . Active Member of Clubs or Organizations:   . Attends Archivist Meetings:   Marland Kitchen Marital Status:   Intimate Partner Violence:   . Fear of Current or Ex-Partner:   . Emotionally Abused:   Marland Kitchen Physically Abused:   . Sexually Abused:     FAMILY HISTORY: Family History  Problem Relation Age of Onset  . Stroke Mother   . Cancer Father   . Heart attack Maternal Grandfather     ALLERGIES:  has no allergies on file.  MEDICATIONS:  Current Outpatient Medications  Medication Sig Dispense Refill  . Omega-3 Fatty Acids (FISH OIL PO) Take 4 capsules by mouth daily.    Marland Kitchen VITAMIN D, CHOLECALCIFEROL, PO Take 1 tablet by mouth daily.    Marland Kitchen aspirin 81 MG tablet Take 81 mg by mouth daily.    . dasatinib (SPRYCEL) 100 MG tablet Take 1 tablet (100 mg total) by mouth daily. 30 tablet 3   No current facility-administered medications for this visit.    REVIEW OF SYSTEMS:   Constitutional: ( - ) fevers, ( - )  chills , ( - ) night sweats Eyes: ( - ) blurriness of vision, ( - ) double vision, ( - ) watery eyes Ears, nose, mouth, throat, and face: ( - ) mucositis, ( - ) sore throat Respiratory: ( - ) cough, ( - ) dyspnea, ( - ) wheezes Cardiovascular: ( - ) palpitation, ( - ) chest discomfort, ( - ) lower extremity swelling Gastrointestinal:  ( - ) nausea, ( - ) heartburn, ( - ) change in bowel habits Skin: ( - ) abnormal skin rashes Lymphatics: ( - ) new lymphadenopathy, ( - ) easy bruising Neurological: ( - ) numbness, ( - ) tingling, ( - ) new weaknesses Behavioral/Psych: ( - ) mood change, ( - ) new changes  All other systems were reviewed with the patient and are negative.  PHYSICAL EXAMINATION: ECOG PERFORMANCE STATUS: 0 - Asymptomatic  Vitals:   02/21/20 1007  BP: 134/82  Pulse: 94  Resp: 18  Temp: 98.5 F (36.9 C)  SpO2: 97%   Filed Weights   02/21/20 1007  Weight: 235 lb 3.2 oz (106.7 kg)    GENERAL: well appearing middle  aged Caucasian male in NAD  SKIN: skin color, texture, turgor are normal, no rashes or significant lesions EYES: conjunctiva are pink and non-injected, sclera clear LYMPH:  no palpable lymphadenopathy in the cervical, axillary or supraclavicular LUNGS: clear to auscultation and percussion with normal breathing effort HEART: regular rate & rhythm and no murmurs and no lower extremity edema ABDOMEN: soft, non-tender, non-distended, normal bowel sounds. No HSM appreciated.  Musculoskeletal: no cyanosis of digits and no clubbing  PSYCH: alert & oriented x 3, fluent speech NEURO: no focal motor/sensory deficits  LABORATORY DATA:  I have  reviewed the data as listed CBC Latest Ref Rng & Units 02/21/2020 01/17/2020  WBC 4.0 - 10.5 K/uL 21.6(H) 18.9(H)  Hemoglobin 13.0 - 17.0 g/dL 16.2 16.2  Hematocrit 39.0 - 52.0 % 49.7 49.5  Platelets 150 - 400 K/uL 549(H) 537(H)    CMP Latest Ref Rng & Units 02/21/2020 01/17/2020  Glucose 70 - 99 mg/dL 108(H) 105(H)  BUN 6 - 20 mg/dL 18 17  Creatinine 0.61 - 1.24 mg/dL 1.30(H) 1.31(H)  Sodium 135 - 145 mmol/L 141 140  Potassium 3.5 - 5.1 mmol/L 4.2 4.1  Chloride 98 - 111 mmol/L 108 106  CO2 22 - 32 mmol/L 24 24  Calcium 8.9 - 10.3 mg/dL 9.2 8.9  Total Protein 6.5 - 8.1 g/dL 6.8 6.6  Total Bilirubin 0.3 - 1.2 mg/dL 0.6 0.5  Alkaline Phos 38 - 126 U/L 89 89  AST 15 - 41 U/L 18 17  ALT 0 - 44 U/L 22 18     PATHOLOGY:  Surgical Pathology  CASE: [id number]  PATIENT: Allen Adams  Flow Pathology Report   Clinical history: Leukocytosis   DIAGNOSIS:   - No monoclonal B-cell, phenotypically aberrant T-cell or distinct  blast population identified  - See comment   COMMENT:   A small immature myeloid population is identified by flow cytometry.  Morphologically these immature cells look like myelocytes and  metamyelocytes; however, promyelocytes could also have similar  immunophenotype. Additional testing maybe warranted in leukocytosis    persists. This case was discussed with Dr. Lindi Adie on February 05, 2020.   GATING AND PHENOTYPIC ANALYSIS:   Gated population: Flow cytometric immunophenotyping is performed using  antibodies to the antigens listed in the table below. Electronic gates  are placed around a cell cluster displaying light scatter properties  corresponding to: lymphocytes and blasts   Abnormal Cells in sample: N/A   Phenotype of Abnormal Cells: N/A            Lymphoid Antigens    Myeloid Antigens  Miscellaneous  CD2 tested  CD10 tested  CD11b   tested  CD45 tested  CD3 tested  CD19 tested  CD11c   ND  HLA-Dr  tested  CD4 tested  CD20 tested  CD13 tested  CD34 tested  CD5 tested  CD22 ND  CD14 tested  CD38 tested  CD7 tested  CD79b   ND  CD15 tested  CD138   ND  CD8 tested  CD103   ND  CD16 tested  TdT ND  CD25 ND  CD200   tested  CD33 tested  CD123   tested  TCRab   ND  sKappa  tested  CD64 tested  CD41 ND  TCRgd   tested  sLambda  tested  CD117   tested  CD61 ND  CD56 tested  cKappa  ND  MPO ND  CD71 ND  CD57 ND  cLambda  ND       CD235a  ND   GROSS DESCRIPTION:   One lavender top tube submitted from [facility] for leukemia and lymphoma  testing.   Final Diagnosis performed by Thressa Sheller, MD.  Electronically signed  02/05/2020  Technical component performed at [facility], [place]  [address], [place], [place] [address].  Professional component performed at [employer]. [facility],  [place] [address], [place], [place] [address].   RADIOGRAPHIC STUDIES:  US Abdomen Complete  Result Date: 01/24/2020 CLINICAL DATA:  Hepatic steatosis EXAM: ABDOMEN ULTRASOUND COMPLETE COMPARISON:  CT abdomen from 04/23/2013 FINDINGS: Gallbladder:  No gallstones observed. Several small gallbladder polyps are present, the largest 0.6 cm in diameter. No sonographic Murphy sign noted by sonographer.  Common bile duct: Diameter: 0.3 cm Liver: No focal lesion identified. Within normal limits in parenchymal echogenicity. Portal vein is patent on color Doppler imaging with normal direction of blood flow towards the liver. IVC: No abnormality visualized. Pancreas: Visualized portion unremarkable. Spleen: Size and appearance within normal limits. Right Kidney: Length: 12.6 cm. Echogenicity within normal limits. No mass or hydronephrosis visualized. Left Kidney: Length: 13.2 cm. Echogenicity within normal limits. No mass or hydronephrosis visualized. Abdominal aorta: No aneurysm visualized. Other findings: None. IMPRESSION: 1. Several small polyps in the gallbladder, largest 0.6 cm in diameter. Based on current ACR recommendations, polyps equal to or below 6 mm in diameter do not require further follow up. Reference: Managing incidental findings on abdominal and pelvic CT and MRI, Part 4: white paper of the ACR Incidental Findings Committee II on gallbladder and biliary findings. J Am Coll Radiol. 2013;10 (12): 953-6. doi:10.1016/j.jacr.2013.05.022 Electronically Signed   By: Van Clines M.D.   On: 01/24/2020 16:25    ASSESSMENT & PLAN OZRO RUSSETT 54 y.o. male with medical history significant for atrial fibrillation, HLD, and OSA who presents for evaluation of newly diagnosed CML.  After review of the labs and discussion with the patient his findings are most consistent with a chronic phase CML.  Fortunately the patient has no evidence of splenomegaly and is currently asymptomatic with otherwise normal hemoglobin.  He does have elevations in his white blood cell count and platelet count, but not elevated to the point that would require cytoreductive therapy upfront.  Today our discussion was focused on the diagnosis of CML in the therapies that are currently available.  We discussed that the mainstay of treatment is TKI therapy which consists of first generation imatinib as well as second-generation  dasatinib, nilotinib, and bosutinib.  Imatinib therapy is typically associated with the lowest rate of side effects, however it also has the lowest rate of developing a sustainable cytogenetic and molecular remission.  We discussed that dasatinib and nilotinib have excellent rates of remission, though they tend to have more concerning side effects.  Bosutinib was also discussed but I was clear that as a first choice it is difficult due to his high levels of diarrhea.  I discussed that dasatinib is known for causing peripheral edema, fatigue, and most concerningly fluid collection within the lungs.Nilotinib is connected with cardiac side effects and given his history of atrial fibrillation I would prefer dasatinib.  After discussing the risks and benefits of all the above therapies the patient was agreeable to starting dasatinib therapy 100 mg daily once we have completed his bone marrow biopsy.  The patient was provided handout information on the CML diagnosis and information regarding dasatinib therapy.  He voiced his understanding of the plan and the risks and benefits.  We will plan to see him back in approximately 1 month after bone marrow biopsy has been completed and he has successfully started TKI therapy.  Sokal Score: 0.7 (Low) EUTOS Score: 70 (Low)   #Chronic Myeloid Leukemia, Chronic Phase (Low Risk) --patient will require a bone marrow biopsy for pathological review and cytogenetics to confirm diagnosis (Blood (2012) 120 (7): 8453-6468) --prior abdominal imaging shows no evidence of splenomegaly.  --once bone marrow biopsy is complete we will start TKI therapy. He is low risk and after discussion of the available TKIs he was agreeable to starting with dasatinib  therapy. We can start with dasatinib therapy 100ng PO daily after the bone marrow biopsy has returned. Risks/benefits discussed as above.  --continue to monitor in clinic q 3 months with BCR/ABL PCR testing. PCR today for pre-therapy  baseline.  --RTC after 1 months of therapy (to assure tolerance of treatment), then at 3 months with labs 1 week prior.   Orders Placed This Encounter  Procedures  . CBC with Differential (Cancer Center Only)    Standing Status:   Future    Number of Occurrences:   1    Standing Expiration Date:   02/20/2021  . CMP ([place] only)    Standing Status:   Future    Number of Occurrences:   1    Standing Expiration Date:   02/20/2021  . Save Smear (SSMR)    Standing Status:   Future    Number of Occurrences:   1    Standing Expiration Date:   02/20/2021  . Hepatitis B surface antibody    Standing Status:   Future    Number of Occurrences:   1    Standing Expiration Date:   02/20/2021  . Hepatitis B surface antigen    Standing Status:   Future    Number of Occurrences:   1    Standing Expiration Date:   02/20/2021  . Hepatitis B core antibody, total    Standing Status:   Future    Number of Occurrences:   1    Standing Expiration Date:   02/20/2021  . bcr/abl-PCR    Standing Status:   Future    Number of Occurrences:   1    Standing Expiration Date:   02/20/2021  . Lactate dehydrogenase (LDH)    Standing Status:   Future    Number of Occurrences:   1    Standing Expiration Date:   02/20/2021    All questions were answered. The patient knows to call the clinic with any problems, questions or concerns.  A total of more than 40 minutes were spent on this encounter and over half of that time was spent on counseling and coordination of care as outlined above.   Ledell Peoples, MD Department of Hematology/Oncology [place] [facility] Phone: [phone] Pager: [phone] Email: [email or web address]  02/23/2020 10:30 AM   Literature Support:  Pearson Forster, Kantarjian H. How I treat newly diagnosed chronic phase CML. Blood. 2012 Aug 16;120(7):1390-7. doi: 10.1182/blood-[id number]. Epub 2012 May 21.

## 2020-02-22 ENCOUNTER — Telehealth: Payer: Self-pay

## 2020-02-22 ENCOUNTER — Telehealth: Payer: Self-pay | Admitting: Hematology and Oncology

## 2020-02-22 NOTE — Telephone Encounter (Signed)
RN placed call to set up patient for bone marrow biopsy.  Left voicemail for return call.

## 2020-02-22 NOTE — Telephone Encounter (Signed)
Scheduled per los. Called and left msg. Mailed printout  °

## 2020-02-23 ENCOUNTER — Encounter: Payer: Self-pay | Admitting: Hematology and Oncology

## 2020-02-23 DIAGNOSIS — Z7189 Other specified counseling: Secondary | ICD-10-CM | POA: Insufficient documentation

## 2020-02-25 ENCOUNTER — Telehealth: Payer: Self-pay

## 2020-02-25 ENCOUNTER — Telehealth: Payer: Self-pay | Admitting: Pharmacist

## 2020-02-25 ENCOUNTER — Other Ambulatory Visit: Payer: Self-pay

## 2020-02-25 DIAGNOSIS — C9210 Chronic myeloid leukemia, BCR/ABL-positive, not having achieved remission: Secondary | ICD-10-CM

## 2020-02-25 MED ORDER — DASATINIB 100 MG PO TABS: 100.0000 mg | ORAL_TABLET | Freq: Every day | ORAL | 3 refills | Status: DC

## 2020-02-25 NOTE — Telephone Encounter (Signed)
Oral Oncology Pharmacist Encounter  Received new prescription for Sprycel (dasatinib) for the treatment of newly diagnosed CML, planned duration until disease progression or unacceptable drug toxicity. Plan to start following a bone marrow biopsy.  Prescription dose and frequency assessed.   Current medication list in Epic reviewed, no relevant DDIs with dasatinib identified.  Prescription has been e-scribed to the [facility] for benefits analysis and approval.  Oral Oncology Clinic will continue to follow for insurance authorization, copayment issues, initial counseling and start date.  Darl Pikes, PharmD, BCPS, BCOP, CPP Hematology/Oncology Clinical Pharmacist [facility] Oral [facility] [phone]  02/25/2020 10:34 AM

## 2020-02-25 NOTE — Progress Notes (Signed)
Pt scheduled for bone marrow biopsy on Wednesday 5/12.    [facility] notified, lab appointment scheduled for post biopsy.   Pt aware, verbalized understanding.

## 2020-02-25 NOTE — Telephone Encounter (Signed)
Oral Chemotherapy Pharmacist Encounter  Due to insurance restriction the medication could not be filled at [facility]. Prescription has been e-scribed to [facility].  Supportive information was faxed to [place]. We will continue to follow medication access.   Attempted to call patient to let him know. No answer, LVM for patient to call me back.  Darl Pikes, PharmD, BCPS, [facility] Hematology/Oncology Clinical Pharmacist [facility]/AP Oral [place] [phone]  02/25/2020 11:02 AM

## 2020-02-25 NOTE — Telephone Encounter (Signed)
Oral Oncology Patient Advocate Encounter  Prior Authorization for Sprycel has been approved.    PA# [id number] Effective dates: 01/26/20 through 02/23/21  Patient must use [facility] will continue to follow.   [place] Patient [place] Phone [phone] Fax [phone] 02/25/2020 9:28 AM

## 2020-02-25 NOTE — Telephone Encounter (Signed)
Oral Oncology Patient Advocate Encounter  Received notification from [facility] that prior authorization for Sprycel is required.  PA submitted on CoverMyMeds [place] Status is pending  Oral Oncology Clinic will continue to follow.  [place] Patient [place] Phone [phone] Fax [phone] 02/25/2020 9:01 AM

## 2020-02-25 NOTE — Telephone Encounter (Signed)
Oral Chemotherapy Pharmacist Encounter  Allen Adams returned my call and I let him know his prescription was sent to [facility]. Provided him with the phone number to [facility].  Patient Education I spoke with patient for overview of new oral chemotherapy medication: Sprycel (dasatinib) for the treatment of newly diagnosed CML, planned duration until disease progression or unacceptable drug toxicity. Plan to start following a bone marrow biopsy.   Counseled patient on administration, dosing, side effects, monitoring, drug-food interactions, safe handling, storage, and disposal. Patient will take 1 tablet (100 mg total) by mouth daily.  Side effects include but not limited to: diarrhea, fluid retention, decreased wbc/hgb/plt.    Reviewed with patient importance of keeping a medication schedule and plan for any missed doses.  Allen Adams voiced understanding and appreciation. All questions answered. Medication handout placed in the mail.  Provided patient with Oral [place] [facility] phone number. Patient knows to call the office with questions or concerns. Oral Chemotherapy Navigation Clinic will continue to follow.  Darl Pikes, PharmD, BCPS, BCOP, CPP Hematology/Oncology Clinical Pharmacist [facility] Oral [place] [facility] [phone]  02/25/2020 11:50 AM

## 2020-03-05 ENCOUNTER — Inpatient Hospital Stay (HOSPITAL_BASED_OUTPATIENT_CLINIC_OR_DEPARTMENT_OTHER): Payer: BC Managed Care – PPO | Admitting: Adult Health

## 2020-03-05 ENCOUNTER — Other Ambulatory Visit: Payer: Self-pay

## 2020-03-05 ENCOUNTER — Inpatient Hospital Stay: Payer: BC Managed Care – PPO | Attending: Hematology and Oncology

## 2020-03-05 VITALS — BP 120/76 | HR 75 | Temp 98.6°F | Resp 16

## 2020-03-05 DIAGNOSIS — D473 Essential (hemorrhagic) thrombocythemia: Secondary | ICD-10-CM | POA: Diagnosis not present

## 2020-03-05 DIAGNOSIS — D72828 Other elevated white blood cell count: Secondary | ICD-10-CM | POA: Diagnosis not present

## 2020-03-05 DIAGNOSIS — D72825 Bandemia: Secondary | ICD-10-CM | POA: Insufficient documentation

## 2020-03-05 DIAGNOSIS — C9210 Chronic myeloid leukemia, BCR/ABL-positive, not having achieved remission: Secondary | ICD-10-CM

## 2020-03-05 LAB — CBC WITH DIFFERENTIAL (CANCER CENTER ONLY)
Abs Immature Granulocytes: 5.15 10*3/uL — ABNORMAL HIGH (ref 0.00–0.07)
Basophils Absolute: 2.7 10*3/uL — ABNORMAL HIGH (ref 0.0–0.1)
Basophils Relative: 12 %
Eosinophils Absolute: 0.2 10*3/uL (ref 0.0–0.5)
Eosinophils Relative: 1 %
HCT: 48 % (ref 39.0–52.0)
Hemoglobin: 15.4 g/dL (ref 13.0–17.0)
Immature Granulocytes: 24 %
Lymphocytes Relative: 8 %
Lymphs Abs: 1.7 10*3/uL (ref 0.7–4.0)
MCH: 28.3 pg (ref 26.0–34.0)
MCHC: 32.1 g/dL (ref 30.0–36.0)
MCV: 88.2 fL (ref 80.0–100.0)
Monocytes Absolute: 0.6 10*3/uL (ref 0.1–1.0)
Monocytes Relative: 3 %
Neutro Abs: 11.6 10*3/uL — ABNORMAL HIGH (ref 1.7–7.7)
Neutrophils Relative %: 52 %
Platelet Count: 499 10*3/uL — ABNORMAL HIGH (ref 150–400)
RBC: 5.44 MIL/uL (ref 4.22–5.81)
RDW: 15.6 % — ABNORMAL HIGH (ref 11.5–15.5)
WBC Count: 21.9 10*3/uL — ABNORMAL HIGH (ref 4.0–10.5)
nRBC: 0 % (ref 0.0–0.2)

## 2020-03-05 MED ORDER — DIPHENHYDRAMINE HCL 25 MG PO CAPS
ORAL_CAPSULE | ORAL | Status: AC
  Filled 2020-03-05: qty 1

## 2020-03-05 MED ORDER — ACETAMINOPHEN 325 MG PO TABS
ORAL_TABLET | ORAL | Status: AC
  Filled 2020-03-05: qty 2

## 2020-03-05 NOTE — Progress Notes (Signed)
Bone Marrow and Aspiration completed. Pt. observed for 30 minutes post. Dressing remains clean, dry, and intact. Discharge instructions given and reviewed with pt. with teach back. Pt. left via ambulation, no respiratory distress noted.

## 2020-03-05 NOTE — Progress Notes (Signed)
INDICATION: CML    Bone Marrow Biopsy and Aspiration Procedure Note   Informed consent was obtained and potential risks including bleeding, infection and pain were reviewed with the patient.  The patient's name, date of birth, identification, consent and allergies were verified prior to the start of procedure and time out was performed.  The left posterior iliac crest was chosen as the site of biopsy.  The skin was prepped with ChloraPrep.   8 cc of 2% lidocaine was used to provide local anaesthesia.   10 cc of bone marrow aspirate was obtained followed by 1cm biopsy.  Pressure was applied to the biopsy site and bandage was placed over the biopsy site. Patient was made to lie on the back for 30 mins prior to discharge.  The procedure was tolerated well. COMPLICATIONS: None BLOOD LOSS: none The patient was discharged home in stable condition with a follow up to review results.  Patient was provided with post bone marrow biopsy instructions and instructed to call if there was any bleeding or worsening pain.  Specimens sent for flow cytometry, cytogenetics and additional studies.  Signed Scot Dock, NP

## 2020-03-05 NOTE — Patient Instructions (Signed)
Bone Marrow Aspiration and Bone Marrow Biopsy, Adult, Care After This sheet gives you information about how to care for yourself after your procedure. Your health care provider may also give you more specific instructions. If you have problems or questions, contact your health care provider. What can I expect after the procedure? After the procedure, it is common to have:  Mild pain and tenderness.  Swelling.  Bruising. Follow these instructions at home: Puncture site care   Follow instructions from your health care provider about how to take care of the puncture site. Make sure you: ? Wash your hands with soap and water before and after you change your bandage (dressing). If soap and water are not available, use hand sanitizer. ? Change your dressing as told by your health care provider.  Check your puncture site every day for signs of infection. Check for: ? More redness, swelling, or pain. ? Fluid or blood. ? Warmth. ? Pus or a bad smell. Activity  Return to your normal activities as told by your health care provider. Ask your health care provider what activities are safe for you.  Do not lift anything that is heavier than 10 lb (4.5 kg), or the limit that you are told, until your health care provider says that it is safe.  Do not drive for 24 hours if you were given a sedative during your procedure. General instructions   Take over-the-counter and prescription medicines only as told by your health care provider.  Do not take baths, swim, or use a hot tub until your health care provider approves. Ask your health care provider if you may take showers. You may only be allowed to take sponge baths.  If directed, put ice on the affected area. To do this: ? Put ice in a plastic bag. ? Place a towel between your skin and the bag. ? Leave the ice on for 20 minutes, 2-3 times a day.  Keep all follow-up visits as told by your health care provider. This is important. Contact a  health care provider if:  Your pain is not controlled with medicine.  You have a fever.  You have more redness, swelling, or pain around the puncture site.  You have fluid or blood coming from the puncture site.  Your puncture site feels warm to the touch.  You have pus or a bad smell coming from the puncture site. Summary  After the procedure, it is common to have mild pain, tenderness, swelling, and bruising.  Follow instructions from your health care provider about how to take care of the puncture site and what activities are safe for you.  Take over-the-counter and prescription medicines only as told by your health care provider.  Contact a health care provider if you have any signs of infection, such as fluid or blood coming from the puncture site. This information is not intended to replace advice given to you by your health care provider. Make sure you discuss any questions you have with your health care provider. Document Revised: 02/27/2019 Document Reviewed: 02/27/2019 Elsevier Patient Education  2020 [employer] Inc.  

## 2020-03-07 LAB — SURGICAL PATHOLOGY

## 2020-03-10 ENCOUNTER — Encounter: Payer: Self-pay | Admitting: *Deleted

## 2020-03-10 DIAGNOSIS — C9210 Chronic myeloid leukemia, BCR/ABL-positive, not having achieved remission: Secondary | ICD-10-CM | POA: Insufficient documentation

## 2020-03-12 ENCOUNTER — Telehealth: Payer: Self-pay | Admitting: Hematology and Oncology

## 2020-03-12 ENCOUNTER — Encounter (HOSPITAL_COMMUNITY): Payer: Self-pay | Admitting: Hematology and Oncology

## 2020-03-12 NOTE — Telephone Encounter (Signed)
Called Allen Adams to discuss the results of his bone marrow biopsy and to assure he had started the dasatinib therapy.   Bone marrow biopsy results were consistent with CML. Patient started taking dasatinib on 03/05/2020 after the bone marrow biopsy procedure. He notes he has tolerated it well over the last week.  We will plan to see Mr. Deem back on 04/03/2020 to assure he is tolerating the medication and see if there is a hematological response. We will check BCR/ABL quant 3 months out from initial dose.   Ledell Peoples, MD Department of Hematology/Oncology [place] at [facility] Phone: [phone] Pager: [phone] Email: [email or web address]

## 2020-03-24 DIAGNOSIS — G4733 Obstructive sleep apnea (adult) (pediatric): Secondary | ICD-10-CM | POA: Diagnosis not present

## 2020-03-30 DIAGNOSIS — S62336A Displaced fracture of neck of fifth metacarpal bone, right hand, initial encounter for closed fracture: Secondary | ICD-10-CM | POA: Diagnosis not present

## 2020-03-30 DIAGNOSIS — W19XXXA Unspecified fall, initial encounter: Secondary | ICD-10-CM | POA: Diagnosis not present

## 2020-03-30 DIAGNOSIS — S62396A Other fracture of fifth metacarpal bone, right hand, initial encounter for closed fracture: Secondary | ICD-10-CM | POA: Diagnosis not present

## 2020-03-30 DIAGNOSIS — S62306A Unspecified fracture of fifth metacarpal bone, right hand, initial encounter for closed fracture: Secondary | ICD-10-CM | POA: Diagnosis not present

## 2020-04-03 ENCOUNTER — Other Ambulatory Visit: Payer: Self-pay | Admitting: Hematology and Oncology

## 2020-04-03 ENCOUNTER — Other Ambulatory Visit: Payer: Self-pay

## 2020-04-03 ENCOUNTER — Inpatient Hospital Stay: Payer: BC Managed Care – PPO | Attending: Hematology and Oncology | Admitting: Hematology and Oncology

## 2020-04-03 ENCOUNTER — Inpatient Hospital Stay: Payer: BC Managed Care – PPO

## 2020-04-03 ENCOUNTER — Encounter: Payer: Self-pay | Admitting: Hematology and Oncology

## 2020-04-03 VITALS — BP 126/68 | HR 88 | Temp 97.9°F | Resp 17 | Ht 76.0 in | Wt 228.8 lb

## 2020-04-03 DIAGNOSIS — Z809 Family history of malignant neoplasm, unspecified: Secondary | ICD-10-CM | POA: Insufficient documentation

## 2020-04-03 DIAGNOSIS — Z79899 Other long term (current) drug therapy: Secondary | ICD-10-CM | POA: Insufficient documentation

## 2020-04-03 DIAGNOSIS — D72818 Other decreased white blood cell count: Secondary | ICD-10-CM

## 2020-04-03 DIAGNOSIS — Z8249 Family history of ischemic heart disease and other diseases of the circulatory system: Secondary | ICD-10-CM | POA: Insufficient documentation

## 2020-04-03 DIAGNOSIS — M79641 Pain in right hand: Secondary | ICD-10-CM | POA: Diagnosis not present

## 2020-04-03 DIAGNOSIS — C9210 Chronic myeloid leukemia, BCR/ABL-positive, not having achieved remission: Secondary | ICD-10-CM

## 2020-04-03 DIAGNOSIS — D72828 Other elevated white blood cell count: Secondary | ICD-10-CM | POA: Insufficient documentation

## 2020-04-03 DIAGNOSIS — Z823 Family history of stroke: Secondary | ICD-10-CM | POA: Diagnosis not present

## 2020-04-03 LAB — CMP (CANCER CENTER ONLY)
ALT: 34 U/L (ref 0–44)
AST: 23 U/L (ref 15–41)
Albumin: 4.3 g/dL (ref 3.5–5.0)
Alkaline Phosphatase: 122 U/L (ref 38–126)
Anion gap: 9 (ref 5–15)
BUN: 21 mg/dL — ABNORMAL HIGH (ref 6–20)
CO2: 22 mmol/L (ref 22–32)
Calcium: 8.8 mg/dL — ABNORMAL LOW (ref 8.9–10.3)
Chloride: 111 mmol/L (ref 98–111)
Creatinine: 1.2 mg/dL (ref 0.61–1.24)
GFR, Est AFR Am: >60 mL/min (ref >60)
GFR, Estimated: >60 mL/min (ref >60)
Glucose, Bld: 108 mg/dL — ABNORMAL HIGH (ref 70–99)
Potassium: 4.1 mmol/L (ref 3.5–5.1)
Sodium: 142 mmol/L (ref 135–145)
Total Bilirubin: 0.8 mg/dL (ref 0.3–1.2)
Total Protein: 6.4 g/dL — ABNORMAL LOW (ref 6.5–8.1)

## 2020-04-03 LAB — CBC WITH DIFFERENTIAL (CANCER CENTER ONLY)
Abs Immature Granulocytes: 0 10*3/uL (ref 0.00–0.07)
Basophils Absolute: 0.1 10*3/uL (ref 0.0–0.1)
Basophils Relative: 2 %
Eosinophils Absolute: 0.2 10*3/uL (ref 0.0–0.5)
Eosinophils Relative: 7 %
HCT: 41.7 % (ref 39.0–52.0)
Hemoglobin: 13.5 g/dL (ref 13.0–17.0)
Immature Granulocytes: 0 %
Lymphocytes Relative: 33 %
Lymphs Abs: 1.1 10*3/uL (ref 0.7–4.0)
MCH: 28.9 pg (ref 26.0–34.0)
MCHC: 32.4 g/dL (ref 30.0–36.0)
MCV: 89.3 fL (ref 80.0–100.0)
Monocytes Absolute: 0.3 10*3/uL (ref 0.1–1.0)
Monocytes Relative: 8 %
Neutro Abs: 1.7 10*3/uL (ref 1.7–7.7)
Neutrophils Relative %: 50 %
Platelet Count: 151 10*3/uL (ref 150–400)
RBC: 4.67 MIL/uL (ref 4.22–5.81)
RDW: 16.2 % — ABNORMAL HIGH (ref 11.5–15.5)
WBC Count: 3.3 10*3/uL — ABNORMAL LOW (ref 4.0–10.5)
nRBC: 0 % (ref 0.0–0.2)

## 2020-04-03 LAB — TSH: TSH: 1.479 u[IU]/mL (ref 0.320–4.118)

## 2020-04-03 NOTE — Progress Notes (Signed)
[place] [phone]   [phone]  PROGRESS NOTE  Patient Care Team: Harlan Stains, MD as PCP - General (Family Medicine)  Hematological/Oncological History # Chronic Myeloid Leukemia, Chronic Phase 1) 06/04/2016: WBC count 15.5, ANC 12.9 2) 01/17/2020: WBC 18.9, Hgb 16.2, Plt 537, MCV 86.7 3) 02/01/2020: establish care with Dr. Lindi Adie. Flow cytometry showed no abnormalities. JAK2 negative. BCR/ABL PCR showed 90.67% positive nuclei for the fusion. Diagnosed with CML 4) 02/21/2020: establish care with Dr. Lorenso Courier 5) 03/05/2020: Bone marrow biopsy performed, findings consistent with CML. Started Dasatinib '100mg'$  PO daily.   Interval History:  Allen Adams 54 y.o. male with medical history significant for CML presents for a follow up visit. The patient's last visit was on 02/21/2020 at which time he established care with Dr. Lorenso Courier. In the interim since the last visit he started dasatinib '100mg'$  daily on 03/05/2020.   On exam today Allen Adams has unfortunately broken his hand.  He had a "freak accident" where he fell and landed on his right hand.  He has been evaluated by orthopedic surgery and is currently in a cast.  He is taking oxycodone for the pain and notes it is under control, but he is right-hand dominated and frequently continues to reach for things with his right hand.  In terms of his CML Mr. Swindler has been tolerating the medication well.  He has been taking it consistently and daily since 03/05/2020 with the exception of the day he fell down and missed a dose that night.  He reports that he has not had any issues with shortness of breath, cough, fevers, chills, sweats, nausea, vomiting or diarrhea.  He denies having any lower extremity swelling.  A full 10 point ROS is listed below.  MEDICAL HISTORY:  Past Medical History:  Diagnosis Date  . Anxiety   . Atrial fibrillation ([place])   . Hypertriglyceridemia   . OSA (obstructive sleep apnea)     (SPLIT 03/05/11, ESS 6, AHI 41/hr REM 90/hr, RDI 83/hr REM 120/hr, O2 nadir 83%; CPAP 9 with AHI 0/hr), Dr Maxwell Caul    SURGICAL HISTORY: Past Surgical History:  Procedure Laterality Date  . Lasik like eye surgery, PPK    . lumbar back surgery      SOCIAL HISTORY: Social History   Socioeconomic History  . Marital status: Married    Spouse name: Not on file  . Number of children: Not on file  . Years of education: Not on file  . Highest education level: Not on file  Occupational History  . Not on file  Tobacco Use  . Smoking status: Never Smoker  Substance and Sexual Activity  . Alcohol use: Not on file  . Drug use: Not on file  . Sexual activity: Not on file  Other Topics Concern  . Not on file  Social History Narrative  . Not on file   Social Determinants of Health   Financial Resource Strain:   . Difficulty of Paying Living Expenses:   Food Insecurity:   . Worried About Charity fundraiser in the Last Year:   . Arboriculturist in the Last Year:   Transportation Needs:   . Film/video editor (Medical):   Marland Kitchen Lack of Transportation (Non-Medical):   Physical Activity:   . Days of Exercise per Week:   . Minutes of Exercise per Session:   Stress:   . Feeling of Stress :   Social Connections:   . Frequency of  Communication with Friends and Family:   . Frequency of Social Gatherings with Friends and Family:   . Attends Religious Services:   . Active Member of Clubs or Organizations:   . Attends Archivist Meetings:   Marland Kitchen Marital Status:   Intimate Partner Violence:   . Fear of Current or Ex-Partner:   . Emotionally Abused:   Marland Kitchen Physically Abused:   . Sexually Abused:     FAMILY HISTORY: Family History  Problem Relation Age of Onset  . Stroke Mother   . Cancer Father   . Heart attack Maternal Grandfather     ALLERGIES:  has No Known Allergies.  MEDICATIONS:  Current Outpatient Medications  Medication Sig Dispense Refill  . aspirin 81 MG tablet  Take 81 mg by mouth daily.    . Aspirin-Calcium Carbonate 81-777 MG TABS Take by mouth.    . dasatinib (SPRYCEL) 100 MG tablet Take 1 tablet (100 mg total) by mouth daily. 30 tablet 3  . Omega-3 Fatty Acids (FISH OIL PO) Take 4 capsules by mouth daily.    Marland Kitchen oxyCODONE (OXY IR/ROXICODONE) 5 MG immediate release tablet Take 5 mg by mouth 2 (two) times daily as needed.    Marland Kitchen VITAMIN D, CHOLECALCIFEROL, PO Take 1 tablet by mouth daily.     No current facility-administered medications for this visit.    REVIEW OF SYSTEMS:   Constitutional: ( - ) fevers, ( - )  chills , ( - ) night sweats Eyes: ( - ) blurriness of vision, ( - ) double vision, ( - ) watery eyes Ears, nose, mouth, throat, and face: ( - ) mucositis, ( - ) sore throat Respiratory: ( - ) cough, ( - ) dyspnea, ( - ) wheezes Cardiovascular: ( - ) palpitation, ( - ) chest discomfort, ( - ) lower extremity swelling Gastrointestinal:  ( - ) nausea, ( - ) heartburn, ( - ) change in bowel habits Skin: ( - ) abnormal skin rashes Lymphatics: ( - ) new lymphadenopathy, ( - ) easy bruising Neurological: ( - ) numbness, ( - ) tingling, ( - ) new weaknesses Behavioral/Psych: ( - ) mood change, ( - ) new changes  All other systems were reviewed with the patient and are negative.  PHYSICAL EXAMINATION: ECOG PERFORMANCE STATUS: 0 - Asymptomatic  Vitals:   04/03/20 0959  BP: 126/68  Pulse: 88  Resp: 17  Temp: 97.9 F (36.6 C)  SpO2: 98%   Filed Weights   04/03/20 0959  Weight: 228 lb 12.8 oz (103.8 kg)    GENERAL: well appearing middle aged Caucasian male. Alert, no distress and comfortable SKIN: skin color, texture, turgor are normal, no rashes or significant lesions EYES: conjunctiva are pink and non-injected, sclera clear LUNGS: clear to auscultation and percussion with normal breathing effort HEART: regular rate & rhythm and no murmurs and no lower extremity edema Musculoskeletal: no cyanosis of digits and no clubbing  PSYCH:  alert & oriented x 3, fluent speech NEURO: no focal motor/sensory deficits  LABORATORY DATA:  I have reviewed the data as listed CBC Latest Ref Rng & Units 04/03/2020 03/05/2020 02/21/2020  WBC 4.0 - 10.5 K/uL 3.3(L) 21.9(H) 21.6(H)  Hemoglobin 13.0 - 17.0 g/dL 13.5 15.4 16.2  Hematocrit 39 - 52 % 41.7 48.0 49.7  Platelets 150 - 400 K/uL 151 499(H) 549(H)    CMP Latest Ref Rng & Units 04/03/2020 02/21/2020 01/17/2020  Glucose 70 - 99 mg/dL 108(H) 108(H) 105(H)  BUN 6 - 20  mg/dL 21(H) 18 17  Creatinine 0.61 - 1.24 mg/dL 1.20 1.30(H) 1.31(H)  Sodium 135 - 145 mmol/L 142 141 140  Potassium 3.5 - 5.1 mmol/L 4.1 4.2 4.1  Chloride 98 - 111 mmol/L 111 108 106  CO2 22 - 32 mmol/L '22 24 24  '$ Calcium 8.9 - 10.3 mg/dL 8.8(L) 9.2 8.9  Total Protein 6.5 - 8.1 g/dL 6.4(L) 6.8 6.6  Total Bilirubin 0.3 - 1.2 mg/dL 0.8 0.6 0.5  Alkaline Phos 38 - 126 U/L 122 89 89  AST 15 - 41 U/L '23 18 17  '$ ALT 0 - 44 U/L 34 22 18    No results found for: MPROTEIN No results found for: KPAFRELGTCHN, LAMBDASER, KAPLAMBRATIO   RADIOGRAPHIC STUDIES: No results found.  ASSESSMENT & PLAN Allen Adams 54 y.o. male with medical history significant for CML presents for a follow up visit.  After review of the labs, and discussion with the patient it is apparent that the patient is currently in a complete hematologic response with a white blood cell count which is returned to normal.  Unfortunately the patient had a mild leukopenia today with an [place] of 1.7.  At this time it is unclear if he has stabilized or if this count continues to decline.  As such I would recommend he return in 2 weeks time for repeat assessment to determine the trajectory of his white blood cell count.    He is tolerating medication quite well with no side effects.  He does not have any evidence of cough, shortness of breath, or fluid on lung exam.  Additionally he does not have any lower extremity swelling and he has been completely asymptomatic.   Overall we will plan to continue dasatinib at the current dose and continue to monitor closely.   Current plan is for continued dasatinib '100mg'$  PO daily. His start date was 03/05/2020.  #CML, Chronic Phase --patient has had a complete hematological response, with WBC down to 3.3 today and no immature forms noted.  --patient is tolerating dasatnib '100mg'$  PO daily with no symptoms at this time. Continue at the current dosage --will assess BCR/ABL PCR at 3 month mark (Approximately August 2021) to determine his molecular response.  -- some mild leukopenia, noted below.  --RTC in 4 weeks with interval 2 week lab check to assure no worsening leukopenia.   #Leukopenia --repeat lab check in 2 weeks to assure patient does not become neutropenic. ANC 1.7 today, WBC 3.3 --no infectious symptoms, otherwise asymptomatic.   No orders of the defined types were placed in this encounter.   All questions were answered. The patient knows to call the clinic with any problems, questions or concerns.  A total of more than 30 minutes were spent on this encounter and over half of that time was spent on counseling and coordination of care as outlined above.   Ledell Peoples, MD Department of Hematology/Oncology [place] at [facility] Phone: [phone] Pager: [phone] Email: [email or web address]  04/03/2020 11:01 AM

## 2020-04-07 LAB — JAK2 (INCLUDING V617F AND EXON 12), MPL,& CALR W/RFL MPN PANEL (NGS)

## 2020-04-07 LAB — BCR/ABL

## 2020-04-07 LAB — BCR ABL1 FISH (GENPATH)

## 2020-04-08 ENCOUNTER — Telehealth: Payer: Self-pay | Admitting: Hematology and Oncology

## 2020-04-08 DIAGNOSIS — S62306A Unspecified fracture of fifth metacarpal bone, right hand, initial encounter for closed fracture: Secondary | ICD-10-CM | POA: Diagnosis not present

## 2020-04-08 NOTE — Telephone Encounter (Signed)
Scheduled per los. Called and left msg. Mailed printout  °

## 2020-04-17 ENCOUNTER — Inpatient Hospital Stay: Payer: BC Managed Care – PPO

## 2020-04-17 ENCOUNTER — Other Ambulatory Visit: Payer: Self-pay

## 2020-04-17 DIAGNOSIS — Z8249 Family history of ischemic heart disease and other diseases of the circulatory system: Secondary | ICD-10-CM | POA: Diagnosis not present

## 2020-04-17 DIAGNOSIS — Z809 Family history of malignant neoplasm, unspecified: Secondary | ICD-10-CM | POA: Diagnosis not present

## 2020-04-17 DIAGNOSIS — Z79899 Other long term (current) drug therapy: Secondary | ICD-10-CM | POA: Diagnosis not present

## 2020-04-17 DIAGNOSIS — Z823 Family history of stroke: Secondary | ICD-10-CM | POA: Diagnosis not present

## 2020-04-17 DIAGNOSIS — D72828 Other elevated white blood cell count: Secondary | ICD-10-CM

## 2020-04-17 DIAGNOSIS — M79641 Pain in right hand: Secondary | ICD-10-CM | POA: Diagnosis not present

## 2020-04-17 DIAGNOSIS — C9210 Chronic myeloid leukemia, BCR/ABL-positive, not having achieved remission: Secondary | ICD-10-CM | POA: Diagnosis not present

## 2020-04-17 LAB — CBC WITH DIFFERENTIAL (CANCER CENTER ONLY)
Abs Immature Granulocytes: 0.01 10*3/uL (ref 0.00–0.07)
Basophils Absolute: 0.1 10*3/uL (ref 0.0–0.1)
Basophils Relative: 2 %
Eosinophils Absolute: 0.3 10*3/uL (ref 0.0–0.5)
Eosinophils Relative: 8 %
HCT: 44.6 % (ref 39.0–52.0)
Hemoglobin: 14.3 g/dL (ref 13.0–17.0)
Immature Granulocytes: 0 %
Lymphocytes Relative: 35 %
Lymphs Abs: 1.3 10*3/uL (ref 0.7–4.0)
MCH: 29 pg (ref 26.0–34.0)
MCHC: 32.1 g/dL (ref 30.0–36.0)
MCV: 90.5 fL (ref 80.0–100.0)
Monocytes Absolute: 0.4 10*3/uL (ref 0.1–1.0)
Monocytes Relative: 11 %
Neutro Abs: 1.6 10*3/uL — ABNORMAL LOW (ref 1.7–7.7)
Neutrophils Relative %: 44 %
Platelet Count: 141 10*3/uL — ABNORMAL LOW (ref 150–400)
RBC: 4.93 MIL/uL (ref 4.22–5.81)
RDW: 16 % — ABNORMAL HIGH (ref 11.5–15.5)
WBC Count: 3.7 10*3/uL — ABNORMAL LOW (ref 4.0–10.5)
nRBC: 0 % (ref 0.0–0.2)

## 2020-04-30 ENCOUNTER — Other Ambulatory Visit: Payer: Self-pay | Admitting: Hematology and Oncology

## 2020-04-30 DIAGNOSIS — C9210 Chronic myeloid leukemia, BCR/ABL-positive, not having achieved remission: Secondary | ICD-10-CM

## 2020-04-30 NOTE — Progress Notes (Signed)
[place] [phone]   Fax:(336) [phone]  PROGRESS NOTE  Patient Care Team: Harlan Stains, MD as PCP - General (Family Medicine)  Hematological/Oncological History # Chronic Myeloid Leukemia, Chronic Phase 1) 06/04/2016: WBC count 15.5, ANC 12.9 2) 01/17/2020: WBC 18.9, Hgb 16.2, Plt 537, MCV 86.7 3) 02/01/2020: establish care with Dr. Lindi Adie. Flow cytometry showed no abnormalities. JAK2 negative. BCR/ABL PCR showed 90.67% positive nuclei for the fusion. Diagnosed with CML 4) 02/21/2020: establish care with Dr. Lorenso Courier 5) 03/05/2020: Bone marrow biopsy performed, findings consistent with CML. Started Dasatinib '100mg'$  PO daily.  6) 04/03/2020: complete hematological response with normalization of blood counts.   Interval History:  Allen Adams 54 y.o. male with medical history significant for CML presents for a follow up visit. The patient's last visit was on 04/03/2020 at which time he was found to have a complete hematologic response. In the interim since the last visit he had an interval CBC to assure his leukopenia was not worsening.   On exam today Allen Adams reports that he feels quite well.  He notes that his hand is recovering and that he is intended to be in a brace for at least another 4 weeks time.  He notes that the pain is about the same and that he is hopeful it will improve soon.  He notes that he has been keeping up with his hydration and has had no issues with the medication.  He notes he has been under a good amount of stress because his wife has been in and out of the hospital.  The only symptom he notices is that he thinks that his hair is not growing back as quickly, but this is not troublesome to him.   In terms of his CML Allen Adams has been tolerating the medication well.  He has been taking it consistently and daily since 03/05/2020 with rare exceptions.  He reports that he has not had any issues with shortness of breath, cough, fevers, chills,  sweats, nausea, vomiting or diarrhea.  He denies having any lower extremity swelling.  A full 10 point ROS is listed below.  MEDICAL HISTORY:  Past Medical History:  Diagnosis Date   Anxiety    Atrial fibrillation ([facility])    Hypertriglyceridemia    OSA (obstructive sleep apnea)    (SPLIT 03/05/11, ESS 6, AHI 41/hr REM 90/hr, RDI 83/hr REM 120/hr, O2 nadir 83%; CPAP 9 with AHI 0/hr), Dr Maxwell Caul    SURGICAL HISTORY: Past Surgical History:  Procedure Laterality Date   Lasik like eye surgery, PPK     lumbar back surgery      SOCIAL HISTORY: Social History   Socioeconomic History   Marital status: Married    Spouse name: Not on file   Number of children: Not on file   Years of education: Not on file   Highest education level: Not on file  Occupational History   Not on file  Tobacco Use   Smoking status: Never Smoker  Substance and Sexual Activity   Alcohol use: Not on file   Drug use: Not on file   Sexual activity: Not on file  Other Topics Concern   Not on file  Social History Narrative   Not on file   Social Determinants of Health   Financial Resource Strain:    Difficulty of Paying Living Expenses:   Food Insecurity:    Worried About [place] in the Last Year:    [place]  of Food in the Last Year:   Transportation Needs:    Film/video editor (Medical):    Lack of Transportation (Non-Medical):   Physical Activity:    Days of Exercise per Week:    Minutes of Exercise per Session:   Stress:    Feeling of Stress :   Social Connections:    Frequency of Communication with Friends and Family:    Frequency of Social Gatherings with Friends and Family:    Attends Religious Services:    Active Member of Clubs or Organizations:    Attends Music therapist:    Marital Status:   Intimate Partner Violence:    Fear of Current or Ex-Partner:    Emotionally Abused:    Physically Abused:    Sexually Abused:      FAMILY HISTORY: Family History  Problem Relation Age of Onset   Stroke Mother    Cancer Father    Heart attack Maternal Grandfather     ALLERGIES:  has No Known Allergies.  MEDICATIONS:  Current Outpatient Medications  Medication Sig Dispense Refill   aspirin 81 MG tablet Take 81 mg by mouth daily.     Aspirin-Calcium Carbonate 81-777 MG TABS Take by mouth.     dasatinib (SPRYCEL) 100 MG tablet Take 1 tablet (100 mg total) by mouth daily. 30 tablet 3   Omega-3 Fatty Acids (FISH OIL PO) Take 4 capsules by mouth daily.     oxyCODONE (OXY IR/ROXICODONE) 5 MG immediate release tablet Take 5 mg by mouth 2 (two) times daily as needed.     VITAMIN D, CHOLECALCIFEROL, PO Take 1 tablet by mouth daily.     No current facility-administered medications for this visit.    REVIEW OF SYSTEMS:   Constitutional: ( - ) fevers, ( - )  chills , ( - ) night sweats Eyes: ( - ) blurriness of vision, ( - ) double vision, ( - ) watery eyes Ears, nose, mouth, throat, and face: ( - ) mucositis, ( - ) sore throat Respiratory: ( - ) cough, ( - ) dyspnea, ( - ) wheezes Cardiovascular: ( - ) palpitation, ( - ) chest discomfort, ( - ) lower extremity swelling Gastrointestinal:  ( - ) nausea, ( - ) heartburn, ( - ) change in bowel habits Skin: ( - ) abnormal skin rashes Lymphatics: ( - ) new lymphadenopathy, ( - ) easy bruising Neurological: ( - ) numbness, ( - ) tingling, ( - ) new weaknesses Behavioral/Psych: ( - ) mood change, ( - ) new changes  All other systems were reviewed with the patient and are negative.  PHYSICAL EXAMINATION: ECOG PERFORMANCE STATUS: 0 - Asymptomatic  Vitals:   05/01/20 1119  BP: 119/76  Pulse: 93  Resp: 18  Temp: 98.8 F (37.1 C)  SpO2: 99%   Filed Weights   05/01/20 1119  Weight: 231 lb 9.6 oz (105.1 kg)    GENERAL: well appearing middle aged Caucasian male. Alert, no distress and comfortable SKIN: skin color, texture, turgor are normal, no rashes or  significant lesions EYES: conjunctiva are pink and non-injected, sclera clear LUNGS: clear to auscultation and percussion with normal breathing effort HEART: regular rate & rhythm and no murmurs and no lower extremity edema Musculoskeletal: no cyanosis of digits and no clubbing  PSYCH: alert & oriented x 3, fluent speech NEURO: no focal motor/sensory deficits  LABORATORY DATA:  I have reviewed the data as listed CBC Latest Ref Rng & Units 05/01/2020 04/17/2020  04/03/2020  WBC 4.0 - 10.5 K/uL 4.5 3.7(L) 3.3(L)  Hemoglobin 13.0 - 17.0 g/dL 15.4 14.3 13.5  Hematocrit 39 - 52 % 47.4 44.6 41.7  Platelets 150 - 400 K/uL 152 141(L) 151    CMP Latest Ref Rng & Units 05/01/2020 04/03/2020 02/21/2020  Glucose 70 - 99 mg/dL 101(H) 108(H) 108(H)  BUN 6 - 20 mg/dL 17 21(H) 18  Creatinine 0.61 - 1.24 mg/dL 1.28(H) 1.20 1.30(H)  Sodium 135 - 145 mmol/L 142 142 141  Potassium 3.5 - 5.1 mmol/L 4.1 4.1 4.2  Chloride 98 - 111 mmol/L 107 111 108  CO2 22 - 32 mmol/L '24 22 24  '$ Calcium 8.9 - 10.3 mg/dL 9.1 8.8(L) 9.2  Total Protein 6.5 - 8.1 g/dL 7.0 6.4(L) 6.8  Total Bilirubin 0.3 - 1.2 mg/dL 0.6 0.8 0.6  Alkaline Phos 38 - 126 U/L 129(H) 122 89  AST 15 - 41 U/L '22 23 18  '$ ALT 0 - 44 U/L 40 34 22    RADIOGRAPHIC STUDIES: No results found.  ASSESSMENT & PLAN Allen Adams 53 y.o. male with medical history significant for CML presents for a follow up visit.  After review of the labs, and discussion with the patient it is apparent that the patient is currently in a complete hematologic response with a white blood cell count which is returned to normal.    He is tolerating medication quite well with no side effects.  He does not have any evidence of cough, shortness of breath, or fluid on lung exam.  Additionally he does not have any lower extremity swelling and he has been asymptomatic.  Overall we will plan to continue dasatinib at the current dose and continue to monitor closely.   Current plan is for  continued dasatinib '100mg'$  PO daily. His start date was 03/05/2020. This is the first line of therapy for this patient.   #CML, Chronic Phase --patient has had a complete hematological response, with WBC improved to normal range and no immature forms noted.  --patient is tolerating dasatnib '100mg'$  PO daily with no concerning symptoms at this time. Continue at the current dosage --will assess BCR/ABL PCR at 3 month mark (Approximately August 2021) to determine his molecular response.  --RTC in 4 weeks   #Leukopenia, resolved --after 4 weeks of treatment patient had an ANC 1.7, WBC 3.3 --rebounded to normal levels on check today.  --no infectious symptoms, otherwise asymptomatic.   No orders of the defined types were placed in this encounter.   All questions were answered. The patient knows to call the clinic with any problems, questions or concerns.  A total of more than 30 minutes were spent on this encounter and over half of that time was spent on counseling and coordination of care as outlined above.   Ledell Peoples, MD Department of Hematology/Oncology [place] at [facility] Phone: [phone] Pager: [phone] Email: [email or web address]  05/01/2020 3:56 PM

## 2020-05-01 ENCOUNTER — Encounter: Payer: Self-pay | Admitting: Hematology and Oncology

## 2020-05-01 ENCOUNTER — Inpatient Hospital Stay: Payer: BC Managed Care – PPO | Attending: Hematology and Oncology | Admitting: Hematology and Oncology

## 2020-05-01 ENCOUNTER — Other Ambulatory Visit: Payer: Self-pay

## 2020-05-01 ENCOUNTER — Inpatient Hospital Stay: Payer: BC Managed Care – PPO

## 2020-05-01 VITALS — BP 119/76 | HR 93 | Temp 98.8°F | Resp 18 | Ht 76.0 in | Wt 231.6 lb

## 2020-05-01 DIAGNOSIS — Z823 Family history of stroke: Secondary | ICD-10-CM | POA: Insufficient documentation

## 2020-05-01 DIAGNOSIS — R52 Pain, unspecified: Secondary | ICD-10-CM | POA: Diagnosis not present

## 2020-05-01 DIAGNOSIS — Z8249 Family history of ischemic heart disease and other diseases of the circulatory system: Secondary | ICD-10-CM | POA: Diagnosis not present

## 2020-05-01 DIAGNOSIS — D72828 Other elevated white blood cell count: Secondary | ICD-10-CM | POA: Diagnosis not present

## 2020-05-01 DIAGNOSIS — Z809 Family history of malignant neoplasm, unspecified: Secondary | ICD-10-CM | POA: Insufficient documentation

## 2020-05-01 DIAGNOSIS — C9210 Chronic myeloid leukemia, BCR/ABL-positive, not having achieved remission: Secondary | ICD-10-CM | POA: Diagnosis not present

## 2020-05-01 DIAGNOSIS — Z79899 Other long term (current) drug therapy: Secondary | ICD-10-CM | POA: Diagnosis not present

## 2020-05-01 LAB — CMP (CANCER CENTER ONLY)
ALT: 40 U/L (ref 0–44)
AST: 22 U/L (ref 15–41)
Albumin: 4.5 g/dL (ref 3.5–5.0)
Alkaline Phosphatase: 129 U/L — ABNORMAL HIGH (ref 38–126)
Anion gap: 11 (ref 5–15)
BUN: 17 mg/dL (ref 6–20)
CO2: 24 mmol/L (ref 22–32)
Calcium: 9.1 mg/dL (ref 8.9–10.3)
Chloride: 107 mmol/L (ref 98–111)
Creatinine: 1.28 mg/dL — ABNORMAL HIGH (ref 0.61–1.24)
GFR, Est AFR Am: >60 mL/min (ref >60)
GFR, Estimated: >60 mL/min (ref >60)
Glucose, Bld: 101 mg/dL — ABNORMAL HIGH (ref 70–99)
Potassium: 4.1 mmol/L (ref 3.5–5.1)
Sodium: 142 mmol/L (ref 135–145)
Total Bilirubin: 0.6 mg/dL (ref 0.3–1.2)
Total Protein: 7 g/dL (ref 6.5–8.1)

## 2020-05-01 LAB — CBC WITH DIFFERENTIAL (CANCER CENTER ONLY)
Abs Immature Granulocytes: 0.06 10*3/uL (ref 0.00–0.07)
Basophils Absolute: 0.1 10*3/uL (ref 0.0–0.1)
Basophils Relative: 2 %
Eosinophils Absolute: 0.2 10*3/uL (ref 0.0–0.5)
Eosinophils Relative: 5 %
HCT: 47.4 % (ref 39.0–52.0)
Hemoglobin: 15.4 g/dL (ref 13.0–17.0)
Immature Granulocytes: 1 %
Lymphocytes Relative: 30 %
Lymphs Abs: 1.3 10*3/uL (ref 0.7–4.0)
MCH: 29.3 pg (ref 26.0–34.0)
MCHC: 32.5 g/dL (ref 30.0–36.0)
MCV: 90.1 fL (ref 80.0–100.0)
Monocytes Absolute: 0.4 10*3/uL (ref 0.1–1.0)
Monocytes Relative: 8 %
Neutro Abs: 2.4 10*3/uL (ref 1.7–7.7)
Neutrophils Relative %: 54 %
Platelet Count: 152 10*3/uL (ref 150–400)
RBC: 5.26 MIL/uL (ref 4.22–5.81)
RDW: 15.5 % (ref 11.5–15.5)
WBC Count: 4.5 10*3/uL (ref 4.0–10.5)
nRBC: 0 % (ref 0.0–0.2)

## 2020-05-01 LAB — SAVE SMEAR(SSMR), FOR PROVIDER SLIDE REVIEW

## 2020-05-01 LAB — LACTATE DEHYDROGENASE: LDH: 188 U/L (ref 98–192)

## 2020-05-02 ENCOUNTER — Telehealth: Payer: Self-pay | Admitting: Hematology and Oncology

## 2020-05-02 NOTE — Telephone Encounter (Signed)
Scheduled per los. Called and left msg. Mailed printout  °

## 2020-05-08 DIAGNOSIS — S62306A Unspecified fracture of fifth metacarpal bone, right hand, initial encounter for closed fracture: Secondary | ICD-10-CM | POA: Diagnosis not present

## 2020-05-29 ENCOUNTER — Other Ambulatory Visit: Payer: Self-pay | Admitting: Hematology and Oncology

## 2020-05-29 DIAGNOSIS — C9210 Chronic myeloid leukemia, BCR/ABL-positive, not having achieved remission: Secondary | ICD-10-CM

## 2020-05-29 NOTE — Progress Notes (Signed)
[place] [phone]   Fax:(336) [phone]  PROGRESS NOTE  Patient Care Team: Allen Stains, MD as PCP - General (Family Medicine)  Hematological/Oncological History # Chronic Myeloid Leukemia, Chronic Phase 1) 06/04/2016: WBC count 15.5, ANC 12.9 2) 01/17/2020: WBC 18.9, Hgb 16.2, Plt 537, MCV 86.7 3) 02/01/2020: establish care with Allen. Lindi Adams. Flow cytometry showed no abnormalities. JAK2 negative. BCR/ABL PCR showed 90.67% positive nuclei for the fusion. Diagnosed with CML 4) 02/21/2020: establish care with Allen. Lorenso Adams 5) 03/05/2020: Bone marrow biopsy performed, findings consistent with CML. Started Dasatinib 154m PO daily.  6) 04/03/2020: complete hematological response with normalization of blood counts.   Interval History:  Allen HELDT54y.o. male with medical history significant for CML presents for a follow up visit. The patient's last visit was on 05/01/2020 at which time he was found to have a complete hematologic response. In the interim since the last visit he has been well with the exception of continued evaluation for the fracture in his right hand.   On exam today Allen Adams he has tolerated the medication quite well without any concerns.  He notes he is not having any shortness of breath, cough, fevers, chills, nausea, vomiting, or diarrhea.  He reports he has been trying to stay hydrated despite the high temperatures outside.  He notes that his weight has increased slightly in the interim up to 235 pounds.  He notes that his sinuses do occasionally stuff up, but he thinks this may be unrelated to the medication.  He is willing and able to continue therapy at this time and is optimistic about treatment moving forward.  A full 10 point ROS is listed below.   MEDICAL HISTORY:  Past Medical History:  Diagnosis Date   Anxiety    Atrial fibrillation (HCC)    Hypertriglyceridemia    OSA (obstructive sleep apnea)    (SPLIT 03/05/11, ESS 6,  AHI 41/hr REM 90/hr, RDI 83/hr REM 120/hr, O2 nadir 83%; CPAP 9 with AHI 0/hr), Allen Adams   SURGICAL HISTORY: Past Surgical History:  Procedure Laterality Date   Lasik like eye surgery, PPK     lumbar back surgery      SOCIAL HISTORY: Social History   Socioeconomic History   Marital status: Married    Spouse name: Not on file   Number of children: Not on file   Years of education: Not on file   Highest education level: Not on file  Occupational History   Not on file  Tobacco Use   Smoking status: Never Smoker   Smokeless tobacco: Never Used  Substance and Sexual Activity   Alcohol use: Not on file   Drug use: Not on file   Sexual activity: Not on file  Other Topics Concern   Not on file  Social History Narrative   Not on file   Social Determinants of Health   Financial Resource Strain:    Difficulty of Paying Living Expenses:   Food Insecurity:    Worried About Allen Adams the Last Year:    RArboriculturistin the Last Year:   Transportation Needs:    Allen Adams(Medical):    Lack of Transportation (Non-Medical):   Physical Activity:    Days of Exercise per Week:    Minutes of Exercise per Session:   Stress:    Feeling of Stress :   Social Connections:    Frequency of Communication with Friends and  Family:    Frequency of Social Gatherings with Friends and Family:    Attends Religious Services:    Active Member of Clubs or Organizations:    Attends Music therapist:    Marital Status:   Intimate Partner Violence:    Fear of Current or Ex-Partner:    Emotionally Abused:    Physically Abused:    Sexually Abused:     FAMILY HISTORY: Family History  Problem Relation Age of Onset   Stroke Mother    Cancer Father    Heart attack Maternal Grandfather     ALLERGIES:  has No Known Allergies.  MEDICATIONS:  Current Outpatient Medications  Medication Sig Dispense Refill   aspirin  81 MG tablet Take 81 mg by mouth daily.     Aspirin-Calcium Carbonate 81-777 MG TABS Take by mouth.     dasatinib (SPRYCEL) 100 MG tablet Take 1 tablet (100 mg total) by mouth daily. 30 tablet 3   Omega-3 Fatty Acids (FISH OIL PO) Take 4 capsules by mouth daily.     oxyCODONE (OXY IR/ROXICODONE) 5 MG immediate release tablet Take 5 mg by mouth 2 (two) times daily as needed.     VITAMIN D, CHOLECALCIFEROL, PO Take 1 tablet by mouth daily.     No current facility-administered medications for this visit.    REVIEW OF SYSTEMS:   Constitutional: ( - ) fevers, ( - )  chills , ( - ) night sweats Eyes: ( - ) blurriness of vision, ( - ) double vision, ( - ) watery eyes Ears, nose, mouth, throat, and face: ( - ) mucositis, ( - ) sore throat Respiratory: ( - ) cough, ( - ) dyspnea, ( - ) wheezes Cardiovascular: ( - ) palpitation, ( - ) chest discomfort, ( - ) lower extremity swelling Gastrointestinal:  ( - ) nausea, ( - ) heartburn, ( - ) change in bowel habits Skin: ( - ) abnormal skin rashes Lymphatics: ( - ) new lymphadenopathy, ( - ) easy bruising Neurological: ( - ) numbness, ( - ) tingling, ( - ) new weaknesses Behavioral/Psych: ( - ) mood change, ( - ) new changes  All other systems were reviewed with the patient and are negative.  PHYSICAL EXAMINATION: ECOG PERFORMANCE STATUS: 0 - Asymptomatic  Vitals:   05/30/20 1424 05/30/20 1425  BP: (!) 160/79 (!) 142/75  Pulse: 99   Resp: 18   Temp: 97.9 F (36.6 C)   SpO2: 99%    Filed Weights   05/30/20 1424  Weight: 235 lb 4.8 oz (106.7 kg)    GENERAL: well appearing middle aged Caucasian male. Alert, no distress and comfortable SKIN: skin color, texture, turgor are normal, no rashes or significant lesions EYES: conjunctiva are pink and non-injected, sclera clear LUNGS: clear to auscultation and percussion with normal breathing effort HEART: regular rate & rhythm and no murmurs and no lower extremity edema Musculoskeletal: no  cyanosis of digits and no clubbing. Large feet  PSYCH: alert & oriented x 3, fluent speech NEURO: no focal motor/sensory deficits  LABORATORY DATA:  I have reviewed the data as listed CBC Latest Ref Rng & Units 05/30/2020 05/01/2020 04/17/2020  WBC 4.0 - 10.5 K/uL 4.8 4.5 3.7(L)  Hemoglobin 13.0 - 17.0 g/dL 15.1 15.4 14.3  Hematocrit 39 - 52 % 44.8 47.4 44.6  Platelets 150 - 400 K/uL 161 152 141(L)    CMP Latest Ref Rng & Units 05/30/2020 05/01/2020 04/03/2020  Glucose 70 - 99 mg/dL 129(H)  101(H) 108(H)  BUN 6 - 20 mg/dL 17 17 21(H)  Creatinine 0.61 - 1.24 mg/dL 1.30(H) 1.28(H) 1.20  Sodium 135 - 145 mmol/L 140 142 142  Potassium 3.5 - 5.1 mmol/L 3.9 4.1 4.1  Chloride 98 - 111 mmol/L 108 107 111  CO2 22 - 32 mmol/L 21(L) 24 22  Calcium 8.9 - 10.3 mg/dL 9.5 9.1 8.8(L)  Total Protein 6.5 - 8.1 g/dL 7.0 7.0 6.4(L)  Total Bilirubin 0.3 - 1.2 mg/dL 0.5 0.6 0.8  Alkaline Phos 38 - 126 U/L 94 129(H) 122  AST 15 - 41 U/L _0 ALT 0 - 44 U/L 37 40 34    RADIOGRAPHIC STUDIES: No results found.  ASSESSMENT & PLAN Allen Adams 55 y.o. male with medical history significant for CML presents for a follow up visit.  After review of the labs, and discussion with the patient it is apparent that the patient is currently in a complete hematologic response with a white blood cell count which is returned to normal.  His first BCR/ABL PCR was ordered today to assure the levels have dropped.   He is tolerating medication quite well with no side effects.  He does not have any evidence of cough, shortness of breath, or fluid on lung exam.  Additionally he does not have any lower extremity swelling and he has been asymptomatic.  Overall we will plan to continue dasatinib at the current dose and continue to monitor closely.   Current plan is for continued dasatinib 143m PO daily. His start date was 03/05/2020. This is the first line of therapy for this patient.   #CML, Chronic Phase --patient has had a  complete hematological response, with WBC improved to normal range and no immature forms noted.  --patient is tolerating dasatnib 1027mPO daily with no concerning symptoms at this time. Continue at the current dosage --will assess BCR/ABL PCR at 3 month mark (today) to determine his molecular response.  --RTC in 12 weeks   #Leukopenia, resolved --after 4 weeks of treatment patient had an ANC 1.7, WBC 3.3 --rebounded to normal levels, remain WNL today.  --no infectious symptoms, otherwise asymptomatic.   No orders of the defined types were placed in this encounter.   All questions were answered. The patient knows to call the clinic with any problems, questions or concerns.  A total of more than 30 minutes were spent on this encounter and over half of that time was spent on counseling and coordination of care as outlined above.   JoLedell PeoplesMD Department of Hematology/Oncology [facility]: [phone]: [phone]: [email or web address]  05/30/2020 6:09 PM

## 2020-05-30 ENCOUNTER — Inpatient Hospital Stay: Payer: BC Managed Care – PPO

## 2020-05-30 ENCOUNTER — Inpatient Hospital Stay: Payer: BC Managed Care – PPO | Attending: Hematology and Oncology | Admitting: Hematology and Oncology

## 2020-05-30 ENCOUNTER — Other Ambulatory Visit: Payer: Self-pay

## 2020-05-30 ENCOUNTER — Encounter: Payer: Self-pay | Admitting: Hematology and Oncology

## 2020-05-30 DIAGNOSIS — C9210 Chronic myeloid leukemia, BCR/ABL-positive, not having achieved remission: Secondary | ICD-10-CM | POA: Insufficient documentation

## 2020-05-30 DIAGNOSIS — Z809 Family history of malignant neoplasm, unspecified: Secondary | ICD-10-CM | POA: Diagnosis not present

## 2020-05-30 DIAGNOSIS — Z8249 Family history of ischemic heart disease and other diseases of the circulatory system: Secondary | ICD-10-CM | POA: Diagnosis not present

## 2020-05-30 DIAGNOSIS — Z7982 Long term (current) use of aspirin: Secondary | ICD-10-CM | POA: Insufficient documentation

## 2020-05-30 DIAGNOSIS — Z823 Family history of stroke: Secondary | ICD-10-CM | POA: Diagnosis not present

## 2020-05-30 DIAGNOSIS — Z79899 Other long term (current) drug therapy: Secondary | ICD-10-CM | POA: Insufficient documentation

## 2020-05-30 LAB — CBC WITH DIFFERENTIAL (CANCER CENTER ONLY)
Abs Immature Granulocytes: 0.01 10*3/uL (ref 0.00–0.07)
Basophils Absolute: 0.1 10*3/uL (ref 0.0–0.1)
Basophils Relative: 1 %
Eosinophils Absolute: 0.1 10*3/uL (ref 0.0–0.5)
Eosinophils Relative: 3 %
HCT: 44.8 % (ref 39.0–52.0)
Hemoglobin: 15.1 g/dL (ref 13.0–17.0)
Immature Granulocytes: 0 %
Lymphocytes Relative: 22 %
Lymphs Abs: 1 10*3/uL (ref 0.7–4.0)
MCH: 29.7 pg (ref 26.0–34.0)
MCHC: 33.7 g/dL (ref 30.0–36.0)
MCV: 88 fL (ref 80.0–100.0)
Monocytes Absolute: 0.4 10*3/uL (ref 0.1–1.0)
Monocytes Relative: 9 %
Neutro Abs: 3.1 10*3/uL (ref 1.7–7.7)
Neutrophils Relative %: 65 %
Platelet Count: 161 10*3/uL (ref 150–400)
RBC: 5.09 MIL/uL (ref 4.22–5.81)
RDW: 14.6 % (ref 11.5–15.5)
WBC Count: 4.8 10*3/uL (ref 4.0–10.5)
nRBC: 0 % (ref 0.0–0.2)

## 2020-05-30 LAB — CMP (CANCER CENTER ONLY)
ALT: 37 U/L (ref 0–44)
AST: 23 U/L (ref 15–41)
Albumin: 4.6 g/dL (ref 3.5–5.0)
Alkaline Phosphatase: 94 U/L (ref 38–126)
Anion gap: 11 (ref 5–15)
BUN: 17 mg/dL (ref 6–20)
CO2: 21 mmol/L — ABNORMAL LOW (ref 22–32)
Calcium: 9.5 mg/dL (ref 8.9–10.3)
Chloride: 108 mmol/L (ref 98–111)
Creatinine: 1.3 mg/dL — ABNORMAL HIGH (ref 0.61–1.24)
GFR, Est AFR Am: >60 mL/min (ref >60)
GFR, Estimated: >60 mL/min (ref >60)
Glucose, Bld: 129 mg/dL — ABNORMAL HIGH (ref 70–99)
Potassium: 3.9 mmol/L (ref 3.5–5.1)
Sodium: 140 mmol/L (ref 135–145)
Total Bilirubin: 0.5 mg/dL (ref 0.3–1.2)
Total Protein: 7 g/dL (ref 6.5–8.1)

## 2020-05-30 LAB — LACTATE DEHYDROGENASE: LDH: 211 U/L — ABNORMAL HIGH (ref 98–192)

## 2020-05-30 MED ORDER — DASATINIB 100 MG PO TABS: 100.0000 mg | ORAL_TABLET | Freq: Every day | ORAL | 3 refills | Status: DC

## 2020-06-03 ENCOUNTER — Telehealth: Payer: Self-pay | Admitting: Hematology and Oncology

## 2020-06-03 NOTE — Telephone Encounter (Signed)
Scheduled per los called and left msg. Mailed printout  

## 2020-06-05 ENCOUNTER — Telehealth: Payer: Self-pay | Admitting: *Deleted

## 2020-06-05 LAB — BCR/ABL

## 2020-06-05 NOTE — Telephone Encounter (Signed)
TCT patient regarding lab results.  Spoke with patient and informed him that his BCR/ABL has gone down from 60.783% to 0.2847%.  Pt very pleased with these results. Advised that his oral treatment of sprycel is doing the job and for him to continue as ordered. He verbalized understanding and is aware of his appt here in November

## 2020-06-12 DIAGNOSIS — S62306A Unspecified fracture of fifth metacarpal bone, right hand, initial encounter for closed fracture: Secondary | ICD-10-CM | POA: Diagnosis not present

## 2020-06-23 DIAGNOSIS — G4733 Obstructive sleep apnea (adult) (pediatric): Secondary | ICD-10-CM | POA: Diagnosis not present

## 2020-07-24 DIAGNOSIS — S62306A Unspecified fracture of fifth metacarpal bone, right hand, initial encounter for closed fracture: Secondary | ICD-10-CM | POA: Diagnosis not present

## 2020-07-31 DIAGNOSIS — Z1152 Encounter for screening for COVID-19: Secondary | ICD-10-CM | POA: Diagnosis not present

## 2020-07-31 DIAGNOSIS — Z1159 Encounter for screening for other viral diseases: Secondary | ICD-10-CM | POA: Diagnosis not present

## 2020-08-06 DIAGNOSIS — Z79899 Other long term (current) drug therapy: Secondary | ICD-10-CM | POA: Diagnosis not present

## 2020-08-06 DIAGNOSIS — S62306A Unspecified fracture of fifth metacarpal bone, right hand, initial encounter for closed fracture: Secondary | ICD-10-CM | POA: Diagnosis not present

## 2020-08-06 DIAGNOSIS — Z7982 Long term (current) use of aspirin: Secondary | ICD-10-CM | POA: Diagnosis not present

## 2020-08-06 DIAGNOSIS — Z856 Personal history of leukemia: Secondary | ICD-10-CM | POA: Diagnosis not present

## 2020-08-06 DIAGNOSIS — Z9989 Dependence on other enabling machines and devices: Secondary | ICD-10-CM | POA: Diagnosis not present

## 2020-08-06 DIAGNOSIS — G4733 Obstructive sleep apnea (adult) (pediatric): Secondary | ICD-10-CM | POA: Diagnosis not present

## 2020-08-06 DIAGNOSIS — M1990 Unspecified osteoarthritis, unspecified site: Secondary | ICD-10-CM | POA: Diagnosis not present

## 2020-08-06 DIAGNOSIS — S62336P Displaced fracture of neck of fifth metacarpal bone, right hand, subsequent encounter for fracture with malunion: Secondary | ICD-10-CM | POA: Diagnosis not present

## 2020-08-06 DIAGNOSIS — I10 Essential (primary) hypertension: Secondary | ICD-10-CM | POA: Diagnosis not present

## 2020-08-12 ENCOUNTER — Other Ambulatory Visit: Payer: BC Managed Care – PPO

## 2020-08-29 ENCOUNTER — Inpatient Hospital Stay: Payer: BC Managed Care – PPO

## 2020-08-29 ENCOUNTER — Inpatient Hospital Stay: Payer: BC Managed Care – PPO | Attending: Hematology and Oncology | Admitting: Hematology and Oncology

## 2020-08-29 ENCOUNTER — Other Ambulatory Visit: Payer: Self-pay | Admitting: Hematology and Oncology

## 2020-08-29 ENCOUNTER — Other Ambulatory Visit: Payer: Self-pay

## 2020-08-29 DIAGNOSIS — Z8249 Family history of ischemic heart disease and other diseases of the circulatory system: Secondary | ICD-10-CM | POA: Insufficient documentation

## 2020-08-29 DIAGNOSIS — Z79899 Other long term (current) drug therapy: Secondary | ICD-10-CM | POA: Diagnosis not present

## 2020-08-29 DIAGNOSIS — C9210 Chronic myeloid leukemia, BCR/ABL-positive, not having achieved remission: Secondary | ICD-10-CM

## 2020-08-29 DIAGNOSIS — Z809 Family history of malignant neoplasm, unspecified: Secondary | ICD-10-CM | POA: Diagnosis not present

## 2020-08-29 DIAGNOSIS — Z823 Family history of stroke: Secondary | ICD-10-CM | POA: Diagnosis not present

## 2020-08-29 DIAGNOSIS — R197 Diarrhea, unspecified: Secondary | ICD-10-CM | POA: Insufficient documentation

## 2020-08-29 MED ORDER — DASATINIB 100 MG PO TABS: 100.0000 mg | ORAL_TABLET | Freq: Every day | ORAL | 3 refills | Status: DC

## 2020-08-29 NOTE — Progress Notes (Signed)
[place] Telephone:(336) [phone]   Fax:(336) [phone]  PROGRESS NOTE  Patient Care Team: Harlan Stains, MD as PCP - General (Family Medicine)  Hematological/Oncological History # Chronic Myeloid Leukemia, Chronic Phase 1) 06/04/2016: WBC count 15.5, ANC 12.9 2) 01/17/2020: WBC 18.9, Hgb 16.2, Plt 537, MCV 86.7 3) 02/01/2020: establish care with Dr. Lindi Adie. Flow cytometry showed no abnormalities. JAK2 negative. BCR/ABL PCR showed 90.67% positive nuclei for the fusion. Diagnosed with CML 4) 02/21/2020: establish care with Dr. Lorenso Courier 5) 03/05/2020: Bone marrow biopsy performed, findings consistent with CML. Started Dasatinib $RemoveBeforeDEI'100mg'[id number]$  PO daily.  6) 04/03/2020: complete hematological response with normalization of blood counts.  7) 05/30/2020: BCR/PCR shows undetectable levels   Interval History:  Allen Adams 54 y.o. male with medical history significant for CML presents for a follow up visit. The patient's last visit was on 08/29/2020 at which time he was found to have a complete molecular response to treatment. In the interim since the last visit he has been well with the exception of continued treatment of the fracture in his right hand.   On exam today Allen Adams reports he has been well in the interim since his last visit.  He reports that his weight has been improving and overall he has been about the same since his last visit.  Appetite has been good with no issues regarding fevers, chills, sweats, nausea, vomiting or constipation.  He does that he is currently on his last bottle of dasatinib and needs a refill today.  Today however he does endorse that he does occasionally have some loose stools and he reports that these bowel movements occur about 5-6 times per week they tend to be fast and loose.  He denies having watery stools or blood in his stool.  Is not associated with any abdominal pain or shortness of breath.  Overall he reports everything else is quite good.  A full 10  point ROS is listed below.   MEDICAL HISTORY:  Past Medical History:  Diagnosis Date  . Anxiety   . Atrial fibrillation ([place])   . Hypertriglyceridemia   . OSA (obstructive sleep apnea)    (SPLIT 03/05/11, ESS 6, AHI 41/hr REM 90/hr, RDI 83/hr REM 120/hr, O2 nadir 83%; CPAP 9 with AHI 0/hr), Dr Maxwell Caul    SURGICAL HISTORY: Past Surgical History:  Procedure Laterality Date  . Lasik like eye surgery, PPK    . lumbar back surgery      SOCIAL HISTORY: Social History   Socioeconomic History  . Marital status: Married    Spouse name: Not on file  . Number of children: Not on file  . Years of education: Not on file  . Highest education level: Not on file  Occupational History  . Not on file  Tobacco Use  . Smoking status: Never Smoker  . Smokeless tobacco: Never Used  Substance and Sexual Activity  . Alcohol use: Not on file  . Drug use: Not on file  . Sexual activity: Not on file  Other Topics Concern  . Not on file  Social History Narrative  . Not on file   Social Determinants of Health   Financial Resource Strain:   . Difficulty of Paying Living Expenses: Not on file  Food Insecurity:   . Worried About Charity fundraiser in the Last Year: Not on file  . Ran Out of Food in the Last Year: Not on file  Transportation Needs:   . Lack of Transportation (Medical): Not  on file  . Lack of Transportation (Non-Medical): Not on file  Physical Activity:   . Days of Exercise per Week: Not on file  . Minutes of Exercise per Session: Not on file  Stress:   . Feeling of Stress : Not on file  Social Connections:   . Frequency of Communication with Friends and Family: Not on file  . Frequency of Social Gatherings with Friends and Family: Not on file  . Attends Religious Services: Not on file  . Active Member of Clubs or Organizations: Not on file  . Attends Archivist Meetings: Not on file  . Marital Status: Not on file  Intimate Partner Violence:   . Fear of  Current or Ex-Partner: Not on file  . Emotionally Abused: Not on file  . Physically Abused: Not on file  . Sexually Abused: Not on file    FAMILY HISTORY: Family History  Problem Relation Age of Onset  . Stroke Mother   . Cancer Father   . Heart attack Maternal Grandfather     ALLERGIES:  has No Known Allergies.  MEDICATIONS:  Current Outpatient Medications  Medication Sig Dispense Refill  . aspirin 81 MG tablet Take 81 mg by mouth daily.    . Aspirin-Calcium Carbonate 81-777 MG TABS Take by mouth.    . dasatinib (SPRYCEL) 100 MG tablet Take 1 tablet (100 mg total) by mouth daily. 30 tablet 3  . Omega-3 Fatty Acids (FISH OIL PO) Take 4 capsules by mouth daily.    Marland Kitchen oxyCODONE (OXY IR/ROXICODONE) 5 MG immediate release tablet Take 5 mg by mouth 2 (two) times daily as needed.    Marland Kitchen VITAMIN D, CHOLECALCIFEROL, PO Take 1 tablet by mouth daily.     No current facility-administered medications for this visit.    REVIEW OF SYSTEMS:   Constitutional: ( - ) fevers, ( - )  chills , ( - ) night sweats Eyes: ( - ) blurriness of vision, ( - ) double vision, ( - ) watery eyes Ears, nose, mouth, throat, and face: ( - ) mucositis, ( - ) sore throat Respiratory: ( - ) cough, ( - ) dyspnea, ( - ) wheezes Cardiovascular: ( - ) palpitation, ( - ) chest discomfort, ( - ) lower extremity swelling Gastrointestinal:  ( - ) nausea, ( - ) heartburn, ( - ) change in bowel habits Skin: ( - ) abnormal skin rashes Lymphatics: ( - ) new lymphadenopathy, ( - ) easy bruising Neurological: ( - ) numbness, ( - ) tingling, ( - ) new weaknesses Behavioral/Psych: ( - ) mood change, ( - ) new changes  All other systems were reviewed with the patient and are negative.  PHYSICAL EXAMINATION: ECOG PERFORMANCE STATUS: 0 - Asymptomatic  Vitals:   08/29/20 1532  BP: (!) 143/76  Pulse: 90  Resp: 18  Temp: 98.2 F (36.8 C)  SpO2: 99%   Filed Weights   08/29/20 1532  Weight: 244 lb 4.8 oz (110.8 kg)     GENERAL: well appearing middle aged Caucasian male. Alert, no distress and comfortable SKIN: skin color, texture, turgor are normal, no rashes or significant lesions EYES: conjunctiva are pink and non-injected, sclera clear LUNGS: clear to auscultation and percussion with normal breathing effort HEART: regular rate & rhythm and no murmurs and no lower extremity edema Musculoskeletal: no cyanosis of digits and no clubbing. Large feet  PSYCH: alert & oriented x 3, fluent speech NEURO: no focal motor/sensory deficits  LABORATORY DATA:  I have reviewed the data as listed CBC Latest Ref Rng & Units 05/30/2020 05/01/2020 04/17/2020  WBC 4.0 - 10.5 K/uL 4.8 4.5 3.7(L)  Hemoglobin 13.0 - 17.0 g/dL 15.1 15.4 14.3  Hematocrit 39 - 52 % 44.8 47.4 44.6  Platelets 150 - 400 K/uL 161 152 141(L)    CMP Latest Ref Rng & Units 05/30/2020 05/01/2020 04/03/2020  Glucose 70 - 99 mg/dL 129(H) 101(H) 108(H)  BUN 6 - 20 mg/dL 17 17 21(H)  Creatinine 0.61 - 1.24 mg/dL 1.30(H) 1.28(H) 1.20  Sodium 135 - 145 mmol/L 140 142 142  Potassium 3.5 - 5.1 mmol/L 3.9 4.1 4.1  Chloride 98 - 111 mmol/L 108 107 111  CO2 22 - 32 mmol/L 21(L) 24 22  Calcium 8.9 - 10.3 mg/dL 9.5 9.1 8.8(L)  Total Protein 6.5 - 8.1 g/dL 7.0 7.0 6.4(L)  Total Bilirubin 0.3 - 1.2 mg/dL 0.5 0.6 0.8  Alkaline Phos 38 - 126 U/L 94 129(H) 122  AST 15 - 41 U/L _0 ALT 0 - 44 U/L 37 40 34    RADIOGRAPHIC STUDIES: No results found.  ASSESSMENT & PLAN PATRICIA PERALES 54 y.o. male with medical history significant for CML presents for a follow up visit.  After review of the labs, and discussion with the patient it is apparent that the patient is currently in a complete molecular response with a white blood cell count which is returned to normal.  His next BCR/ABL PCR was ordered today to assure the levels remain low.    On exam today Mr. Epps does endorse having some occasional loose stools, though these have not been changing his daily life  and has not been associated with any abdominal pain or other symptoms..  Overall we will plan to continue dasatinib at the current dose and continue to monitor closely.   Current plan is for continued dasatinib 128m PO daily. His start date was 03/05/2020. This is the first line of therapy for this patient.   #CML, Chronic Phase --patient has had a complete molecular response, with undetectable BCR/ABL on PCR.  --patient is tolerating dasatnib 1055mPO daily with no concerning symptoms at this time. Continue at the current dosage --will assess BCR/ABL PCR q 3 months (today) to determine his molecular response.  --RTC in 12 weeks   #Loose Stools --occasional, will continue to monitor   #Leukopenia, resolved --after 4 weeks of treatment patient had an ANC 1.7, WBC 3.3 --rebounded to normal levels, remain WNL today.  --no infectious symptoms, otherwise asymptomatic.   No orders of the defined types were placed in this encounter.   All questions were answered. The patient knows to call the clinic with any problems, questions or concerns.  A total of more than 30 minutes were spent on this encounter and over half of that time was spent on counseling and coordination of care as outlined above.   JoLedell PeoplesMD Department of Hematology/Oncology [facility] [place]: [phone]: [phone]: [email or web address]  08/29/2020 3:45 PM

## 2020-08-31 ENCOUNTER — Encounter: Payer: Self-pay | Admitting: Hematology and Oncology

## 2020-09-01 ENCOUNTER — Telehealth: Payer: Self-pay | Admitting: Hematology and Oncology

## 2020-09-01 NOTE — Telephone Encounter (Signed)
Scheduled per los. Called and left msg. Mailed printout  °

## 2020-09-02 ENCOUNTER — Inpatient Hospital Stay: Payer: BC Managed Care – PPO

## 2020-09-02 ENCOUNTER — Other Ambulatory Visit: Payer: Self-pay

## 2020-09-02 DIAGNOSIS — C9210 Chronic myeloid leukemia, BCR/ABL-positive, not having achieved remission: Secondary | ICD-10-CM | POA: Diagnosis not present

## 2020-09-02 DIAGNOSIS — R197 Diarrhea, unspecified: Secondary | ICD-10-CM | POA: Diagnosis not present

## 2020-09-02 DIAGNOSIS — Z79899 Other long term (current) drug therapy: Secondary | ICD-10-CM | POA: Diagnosis not present

## 2020-09-02 DIAGNOSIS — Z8249 Family history of ischemic heart disease and other diseases of the circulatory system: Secondary | ICD-10-CM | POA: Diagnosis not present

## 2020-09-02 DIAGNOSIS — Z809 Family history of malignant neoplasm, unspecified: Secondary | ICD-10-CM | POA: Diagnosis not present

## 2020-09-02 DIAGNOSIS — Z823 Family history of stroke: Secondary | ICD-10-CM | POA: Diagnosis not present

## 2020-09-02 LAB — CMP (CANCER CENTER ONLY)
ALT: 24 U/L (ref 0–44)
AST: 21 U/L (ref 15–41)
Albumin: 4.5 g/dL (ref 3.5–5.0)
Alkaline Phosphatase: 68 U/L (ref 38–126)
Anion gap: 8 (ref 5–15)
BUN: 16 mg/dL (ref 6–20)
CO2: 26 mmol/L (ref 22–32)
Calcium: 8.9 mg/dL (ref 8.9–10.3)
Chloride: 106 mmol/L (ref 98–111)
Creatinine: 1.27 mg/dL — ABNORMAL HIGH (ref 0.61–1.24)
GFR, Estimated: >60 mL/min (ref >60)
Glucose, Bld: 94 mg/dL (ref 70–99)
Potassium: 3.9 mmol/L (ref 3.5–5.1)
Sodium: 140 mmol/L (ref 135–145)
Total Bilirubin: 0.7 mg/dL (ref 0.3–1.2)
Total Protein: 7 g/dL (ref 6.5–8.1)

## 2020-09-02 LAB — CBC WITH DIFFERENTIAL (CANCER CENTER ONLY)
Abs Immature Granulocytes: 0.02 10*3/uL (ref 0.00–0.07)
Basophils Absolute: 0.1 10*3/uL (ref 0.0–0.1)
Basophils Relative: 1 %
Eosinophils Absolute: 0.1 10*3/uL (ref 0.0–0.5)
Eosinophils Relative: 2 %
HCT: 41.5 % (ref 39.0–52.0)
Hemoglobin: 14.1 g/dL (ref 13.0–17.0)
Immature Granulocytes: 0 %
Lymphocytes Relative: 25 %
Lymphs Abs: 1.4 10*3/uL (ref 0.7–4.0)
MCH: 30.7 pg (ref 26.0–34.0)
MCHC: 34 g/dL (ref 30.0–36.0)
MCV: 90.4 fL (ref 80.0–100.0)
Monocytes Absolute: 0.6 10*3/uL (ref 0.1–1.0)
Monocytes Relative: 10 %
Neutro Abs: 3.6 10*3/uL (ref 1.7–7.7)
Neutrophils Relative %: 62 %
Platelet Count: 181 10*3/uL (ref 150–400)
RBC: 4.59 MIL/uL (ref 4.22–5.81)
RDW: 13.9 % (ref 11.5–15.5)
WBC Count: 5.7 10*3/uL (ref 4.0–10.5)
nRBC: 0 % (ref 0.0–0.2)

## 2020-09-02 LAB — LACTATE DEHYDROGENASE: LDH: 230 U/L — ABNORMAL HIGH (ref 98–192)

## 2020-09-09 DIAGNOSIS — G4733 Obstructive sleep apnea (adult) (pediatric): Secondary | ICD-10-CM | POA: Diagnosis not present

## 2020-09-09 LAB — BCR/ABL

## 2020-09-10 ENCOUNTER — Telehealth: Payer: Self-pay | Admitting: *Deleted

## 2020-09-10 NOTE — Telephone Encounter (Signed)
TCT patient regarding recent lab results. No answer but was able to leave vm message on identified phone regarding BCR/ABL results. Advised that pt can call back with any questions or concerns. Advised that the BCR/ABL was undetectable and that Dr. Lorenso Courier states that he is doing 'excellent'. We will see him back in 3 months.

## 2020-09-10 NOTE — Telephone Encounter (Signed)
-----   Message from Orson Slick, MD sent at 09/10/2020  9:45 AM EST ----- Please let Mr. Skolnick know that his BCR/ABL is undetectable. He is doing excellent. We will see him back in about 3 months.  ----- Message ----- From: Interface, Lab In [place] Sent: 09/02/2020   2:46 PM EST To: Orson Slick, MD

## 2020-09-22 DIAGNOSIS — G4733 Obstructive sleep apnea (adult) (pediatric): Secondary | ICD-10-CM | POA: Diagnosis not present

## 2020-10-02 DIAGNOSIS — S62306A Unspecified fracture of fifth metacarpal bone, right hand, initial encounter for closed fracture: Secondary | ICD-10-CM | POA: Diagnosis not present

## 2020-11-17 DIAGNOSIS — Z23 Encounter for immunization: Secondary | ICD-10-CM | POA: Diagnosis not present

## 2020-11-17 DIAGNOSIS — Z789 Other specified health status: Secondary | ICD-10-CM | POA: Diagnosis not present

## 2020-11-17 DIAGNOSIS — Z0000 Encounter for general adult medical examination without abnormal findings: Secondary | ICD-10-CM | POA: Diagnosis not present

## 2020-11-17 DIAGNOSIS — E785 Hyperlipidemia, unspecified: Secondary | ICD-10-CM | POA: Diagnosis not present

## 2020-11-17 DIAGNOSIS — Z125 Encounter for screening for malignant neoplasm of prostate: Secondary | ICD-10-CM | POA: Diagnosis not present

## 2020-11-17 DIAGNOSIS — Z1159 Encounter for screening for other viral diseases: Secondary | ICD-10-CM | POA: Diagnosis not present

## 2020-11-21 ENCOUNTER — Telehealth: Payer: Self-pay | Admitting: *Deleted

## 2020-11-21 NOTE — Telephone Encounter (Signed)
Received call from patient requesting to move his appts from Friday afternoon to Thursday mid day.  Scheduling message sent

## 2020-11-27 ENCOUNTER — Inpatient Hospital Stay: Payer: BC Managed Care – PPO | Attending: Hematology and Oncology

## 2020-11-27 ENCOUNTER — Other Ambulatory Visit: Payer: Self-pay

## 2020-11-27 ENCOUNTER — Other Ambulatory Visit: Payer: Self-pay | Admitting: Hematology and Oncology

## 2020-11-27 ENCOUNTER — Other Ambulatory Visit: Payer: BC Managed Care – PPO

## 2020-11-27 ENCOUNTER — Inpatient Hospital Stay (HOSPITAL_BASED_OUTPATIENT_CLINIC_OR_DEPARTMENT_OTHER): Payer: BC Managed Care – PPO | Admitting: Hematology and Oncology

## 2020-11-27 VITALS — BP 141/82 | HR 84 | Temp 97.4°F | Resp 18 | Ht 76.0 in | Wt 250.3 lb

## 2020-11-27 DIAGNOSIS — Z8249 Family history of ischemic heart disease and other diseases of the circulatory system: Secondary | ICD-10-CM | POA: Diagnosis not present

## 2020-11-27 DIAGNOSIS — R197 Diarrhea, unspecified: Secondary | ICD-10-CM | POA: Insufficient documentation

## 2020-11-27 DIAGNOSIS — C9210 Chronic myeloid leukemia, BCR/ABL-positive, not having achieved remission: Secondary | ICD-10-CM | POA: Diagnosis not present

## 2020-11-27 DIAGNOSIS — Z823 Family history of stroke: Secondary | ICD-10-CM | POA: Diagnosis not present

## 2020-11-27 DIAGNOSIS — Z79899 Other long term (current) drug therapy: Secondary | ICD-10-CM | POA: Insufficient documentation

## 2020-11-27 DIAGNOSIS — Z809 Family history of malignant neoplasm, unspecified: Secondary | ICD-10-CM | POA: Diagnosis not present

## 2020-11-27 LAB — CMP (CANCER CENTER ONLY)
ALT: 21 U/L (ref 0–44)
AST: 20 U/L (ref 15–41)
Albumin: 4.5 g/dL (ref 3.5–5.0)
Alkaline Phosphatase: 65 U/L (ref 38–126)
Anion gap: 8 (ref 5–15)
BUN: 18 mg/dL (ref 6–20)
CO2: 26 mmol/L (ref 22–32)
Calcium: 8.9 mg/dL (ref 8.9–10.3)
Chloride: 107 mmol/L (ref 98–111)
Creatinine: 1.39 mg/dL — ABNORMAL HIGH (ref 0.61–1.24)
GFR, Estimated: >60 mL/min (ref >60)
Glucose, Bld: 99 mg/dL (ref 70–99)
Potassium: 4.4 mmol/L (ref 3.5–5.1)
Sodium: 141 mmol/L (ref 135–145)
Total Bilirubin: 0.7 mg/dL (ref 0.3–1.2)
Total Protein: 7 g/dL (ref 6.5–8.1)

## 2020-11-27 LAB — CBC WITH DIFFERENTIAL (CANCER CENTER ONLY)
Abs Immature Granulocytes: 0.02 10*3/uL (ref 0.00–0.07)
Basophils Absolute: 0.1 10*3/uL (ref 0.0–0.1)
Basophils Relative: 1 %
Eosinophils Absolute: 0.2 10*3/uL (ref 0.0–0.5)
Eosinophils Relative: 3 %
HCT: 44.8 % (ref 39.0–52.0)
Hemoglobin: 14.6 g/dL (ref 13.0–17.0)
Immature Granulocytes: 0 %
Lymphocytes Relative: 30 %
Lymphs Abs: 1.6 10*3/uL (ref 0.7–4.0)
MCH: 30 pg (ref 26.0–34.0)
MCHC: 32.6 g/dL (ref 30.0–36.0)
MCV: 92 fL (ref 80.0–100.0)
Monocytes Absolute: 0.5 10*3/uL (ref 0.1–1.0)
Monocytes Relative: 10 %
Neutro Abs: 3 10*3/uL (ref 1.7–7.7)
Neutrophils Relative %: 56 %
Platelet Count: 180 10*3/uL (ref 150–400)
RBC: 4.87 MIL/uL (ref 4.22–5.81)
RDW: 13.7 % (ref 11.5–15.5)
WBC Count: 5.4 10*3/uL (ref 4.0–10.5)
nRBC: 0 % (ref 0.0–0.2)

## 2020-11-27 LAB — LACTATE DEHYDROGENASE: LDH: 207 U/L — ABNORMAL HIGH (ref 98–192)

## 2020-11-27 NOTE — Progress Notes (Signed)
[place] [phone]   Fax:([phone]  PROGRESS NOTE  Patient Care Team: Harlan Stains, MD as PCP - General (Family Medicine)  Hematological/Oncological History # Chronic Myeloid Leukemia, Chronic Phase 1) 06/04/2016: WBC count 15.5, ANC 12.9 2) 01/17/2020: WBC 18.9, Hgb 16.2, Plt 537, MCV 86.7 3) 02/01/2020: establish care with Dr. Lindi Adie. Flow cytometry showed no abnormalities. JAK2 negative. BCR/ABL PCR showed 90.67% positive nuclei for the fusion. Diagnosed with CML 4) 02/21/2020: establish care with Dr. Lorenso Courier 5) 03/05/2020: Bone marrow biopsy performed, findings consistent with CML. Started Dasatinib 130m PO daily.  6) 04/03/2020: complete hematological response with normalization of blood counts.  7) 05/30/2020: BCR/PCR shows undetectable levels 8) 08/29/2020: BCR/PCR shows undetectable levels   Interval History:  Allen GALLO55y.o. male with medical history significant for CML presents for a follow up visit. The patient's last visit was on 08/29/2020 at which time he was found to have a complete molecular response to treatment. In the interim since the last visit he has been well with no major changes in his health.   On exam today Allen Adams he has been well in the interim since her last visit.  He reports he had a good holiday season and he visits some local relatives.  He reports he has been staying at home mostly and trying to avoid catching the coronavirus.  He currently denies having issues with nausea, vomiting, or diarrhea.  His appetite is good and he has gained a few pounds.  He also has a cast off his hand and has a well hearing scar on the right hand.  He notes that he is well aware of his metal plate in his hand when it gets cold, but otherwise it is healing well.  He otherwise denies any fevers, chills, sweats.  A full 10 point ROS is listed below.  He is tolerating his dasatinib therapy without any complication is willing and able  to consider at this time.   MEDICAL HISTORY:  Past Medical History:  Diagnosis Date  . Anxiety   . Atrial fibrillation ([place]   . Hypertriglyceridemia   . OSA (obstructive sleep apnea)    (SPLIT 03/05/11, ESS 6, AHI 41/hr REM 90/hr, RDI 83/hr REM 120/hr, O2 nadir 83%; CPAP 9 with AHI 0/hr), Dr OMaxwell Caul   SURGICAL HISTORY: Past Surgical History:  Procedure Laterality Date  . Lasik like eye surgery, PPK    . lumbar back surgery      SOCIAL HISTORY: Social History   Socioeconomic History  . Marital status: Married    Spouse name: Not on file  . Number of children: Not on file  . Years of education: Not on file  . Highest education level: Not on file  Occupational History  . Not on file  Tobacco Use  . Smoking status: Never Smoker  . Smokeless tobacco: Never Used  Substance and Sexual Activity  . Alcohol use: Not on file  . Drug use: Not on file  . Sexual activity: Not on file  Other Topics Concern  . Not on file  Social History Narrative  . Not on file   Social Determinants of Health   Financial Resource Strain: Not on file  Food Insecurity: Not on file  Transportation Needs: Not on file  Physical Activity: Not on file  Stress: Not on file  Social Connections: Not on file  Intimate Partner Violence: Not on file    FAMILY HISTORY: Family History  Problem Relation  Age of Onset  . Stroke Mother   . Cancer Father   . Heart attack Maternal Grandfather     ALLERGIES:  has No Known Allergies.  MEDICATIONS:  Current Outpatient Medications  Medication Sig Dispense Refill  . aspirin 81 MG tablet Take 81 mg by mouth daily.    . Aspirin-Calcium Carbonate 81-777 MG TABS Take by mouth.    . dasatinib (SPRYCEL) 100 MG tablet Take 1 tablet (100 mg total) by mouth daily. 30 tablet 3  . Omega-3 Fatty Acids (FISH OIL PO) Take 4 capsules by mouth daily.    Marland Kitchen VITAMIN D, CHOLECALCIFEROL, PO Take 1 tablet by mouth daily.     No current facility-administered medications  for this visit.    REVIEW OF SYSTEMS:   Constitutional: ( - ) fevers, ( - )  chills , ( - ) night sweats Eyes: ( - ) blurriness of vision, ( - ) double vision, ( - ) watery eyes Ears, nose, mouth, throat, and face: ( - ) mucositis, ( - ) sore throat Respiratory: ( - ) cough, ( - ) dyspnea, ( - ) wheezes Cardiovascular: ( - ) palpitation, ( - ) chest discomfort, ( - ) lower extremity swelling Gastrointestinal:  ( - ) nausea, ( - ) heartburn, ( - ) change in bowel habits Skin: ( - ) abnormal skin rashes Lymphatics: ( - ) new lymphadenopathy, ( - ) easy bruising Neurological: ( - ) numbness, ( - ) tingling, ( - ) new weaknesses Behavioral/Psych: ( - ) mood change, ( - ) new changes  All other systems were reviewed with the patient and are negative.  PHYSICAL EXAMINATION: ECOG PERFORMANCE STATUS: 0 - Asymptomatic  Vitals:   11/27/20 1304  BP: (!) 141/82  Pulse: 84  Resp: 18  Temp: (!) 97.4 F (36.3 C)  SpO2: 100%   Filed Weights   11/27/20 1304  Weight: 250 lb 4.8 oz (113.5 kg)    GENERAL: well appearing middle aged Caucasian male. Alert, no distress and comfortable SKIN: skin color, texture, turgor are normal, no rashes or significant lesions EYES: conjunctiva are pink and non-injected, sclera clear LUNGS: clear to auscultation and percussion with normal breathing effort HEART: regular rate & rhythm and no murmurs and no lower extremity edema Musculoskeletal: no cyanosis of digits and no clubbing. Large feet  PSYCH: alert & oriented x 3, fluent speech NEURO: no focal motor/sensory deficits  LABORATORY DATA:  I have reviewed the data as listed CBC Latest Ref Rng & Units 11/27/2020 09/02/2020 05/30/2020  WBC 4.0 - 10.5 K/uL 5.4 5.7 4.8  Hemoglobin 13.0 - 17.0 g/dL 14.6 14.1 15.1  Hematocrit 39.0 - 52.0 % 44.8 41.5 44.8  Platelets 150 - 400 K/uL 180 181 161    CMP Latest Ref Rng & Units 11/27/2020 09/02/2020 05/30/2020  Glucose 70 - 99 mg/dL 99 94 129(H)  BUN 6 - 20 mg/dL _0 Creatinine 0.61 - 1.24 mg/dL 1.39(H) 1.27(H) 1.30(H)  Sodium 135 - 145 mmol/L 141 140 140  Potassium 3.5 - 5.1 mmol/L 4.4 3.9 3.9  Chloride 98 - 111 mmol/L 107 106 108  CO2 22 - 32 mmol/L 26 26 21(L)  Calcium 8.9 - 10.3 mg/dL 8.9 8.9 9.5  Total Protein 6.5 - 8.1 g/dL 7.0 7.0 7.0  Total Bilirubin 0.3 - 1.2 mg/dL 0.7 0.7 0.5  Alkaline Phos 38 - 126 U/L 65 68 94  AST 15 - 41 U/L _1 ALT 0 - 44  U/L 21 24 37    RADIOGRAPHIC STUDIES: No results found.  ASSESSMENT & PLAN Allen Adams 55 y.o. male with medical history significant for CML presents for a follow up visit.  After review of the labs, and discussion with the patient it is apparent that the patient is currently in a complete molecular response and has a white blood cell count that returned to normal.  His next BCR/ABL PCR was ordered today to assure the levels remain undetectable.    On exam today Allen Adams reports he is at his baseline level of health. Overall we will plan to continue dasatinib at the current dose and continue to monitor closely.   Current plan is for continued dasatinib 158m PO daily. His start date was 03/05/2020. This is the first line of therapy for this patient.   #CML, Chronic Phase --patient has had a complete molecular response, with undetectable BCR/ABL on PCR.  --patient is tolerating dasatnib 1048mPO daily with no concerning symptoms at this time. Continue at the current dosage --will assess BCR/ABL PCR q 3 months (today) to determine his molecular response.  --RTC in 12 weeks   #Loose Stools --occasional, will continue to monitor  --none reported at today's visit  #Leukopenia, resolved --after 4 weeks of treatment patient had an ANC 1.7, WBC 3.3 --rebounded to normal levels, remain WNL today.  --no infectious symptoms, otherwise asymptomatic.   No orders of the defined types were placed in this encounter.   All questions were answered. The patient knows to call the clinic with  any problems, questions or concerns.  A total of more than 30 minutes were spent on this encounter and over half of that time was spent on counseling and coordination of care as outlined above.   JoLedell PeoplesMD Department of Hematology/Oncology [place] WeAsp[facility] & Clinicshone: [phone]: [phone]: [email or web address]  11/27/2020 2:57 PM

## 2020-11-28 ENCOUNTER — Inpatient Hospital Stay: Payer: BC Managed Care – PPO | Admitting: Hematology and Oncology

## 2020-11-28 ENCOUNTER — Inpatient Hospital Stay: Payer: BC Managed Care – PPO

## 2020-12-01 ENCOUNTER — Telehealth: Payer: Self-pay | Admitting: Hematology and Oncology

## 2020-12-01 IMAGING — US US ABDOMEN COMPLETE
1 series · 13 of 25 positions shown · non-contrast
Comparison: CT abdomen from 04/23/2013

CLINICAL DATA: Hepatic steatosis

EXAM:
ABDOMEN ULTRASOUND COMPLETE

[Series 1: us abdomen complete · 13 of 97 slices shown]
[im 1/97]
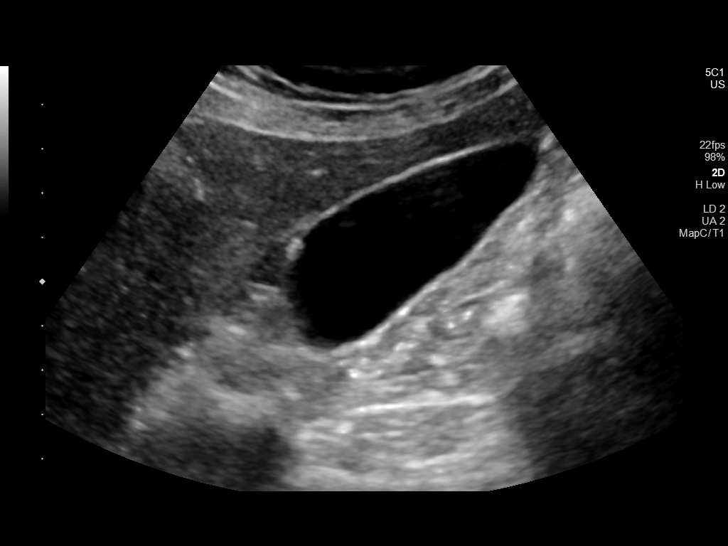
[im 9/97]
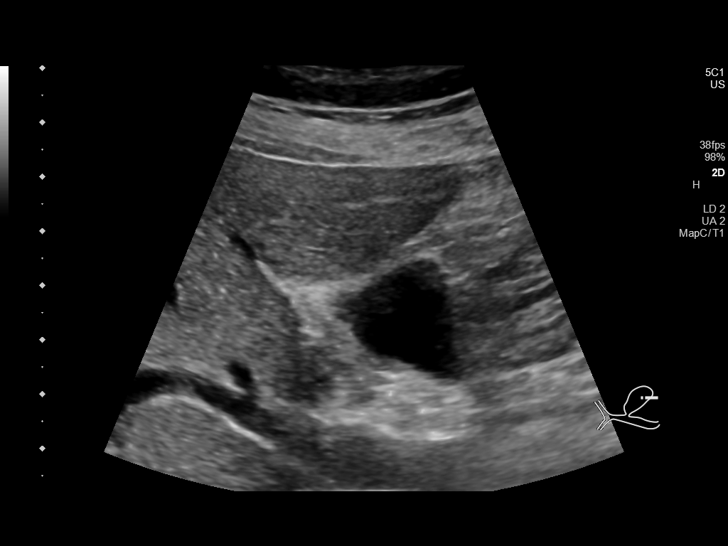
[im 17/97]
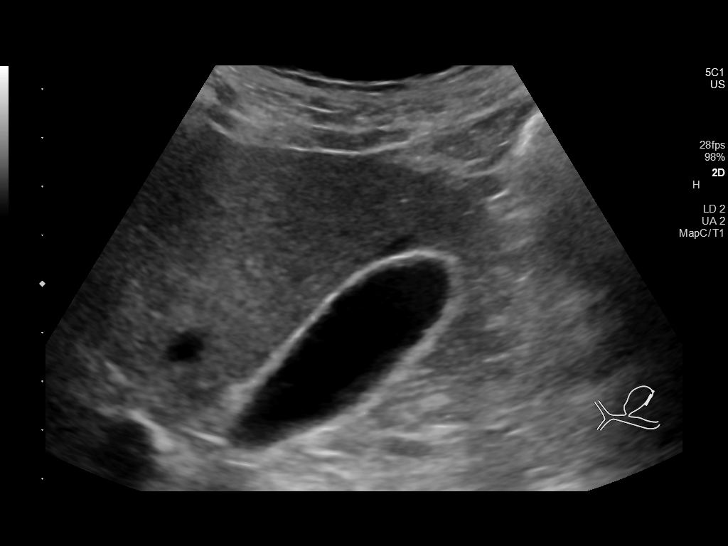
[im 25/97]
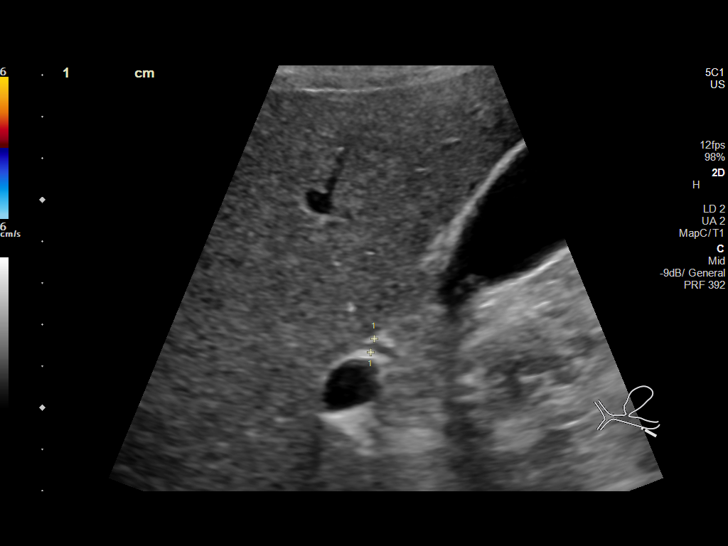
[im 33/97]
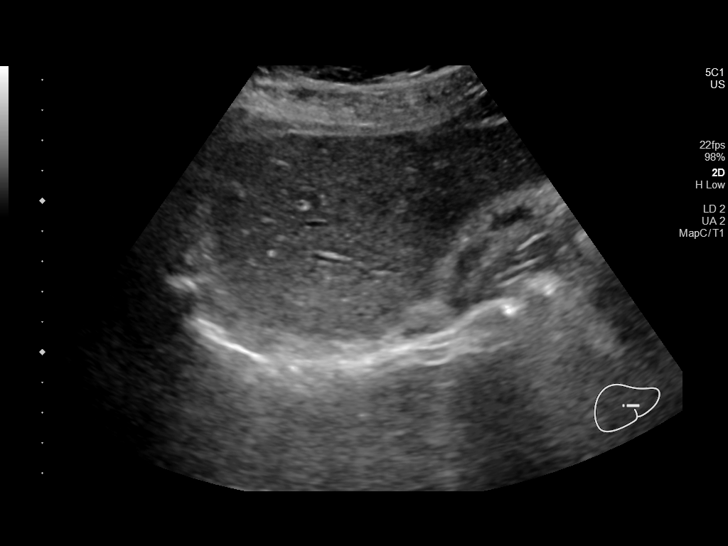
[im 41/97]
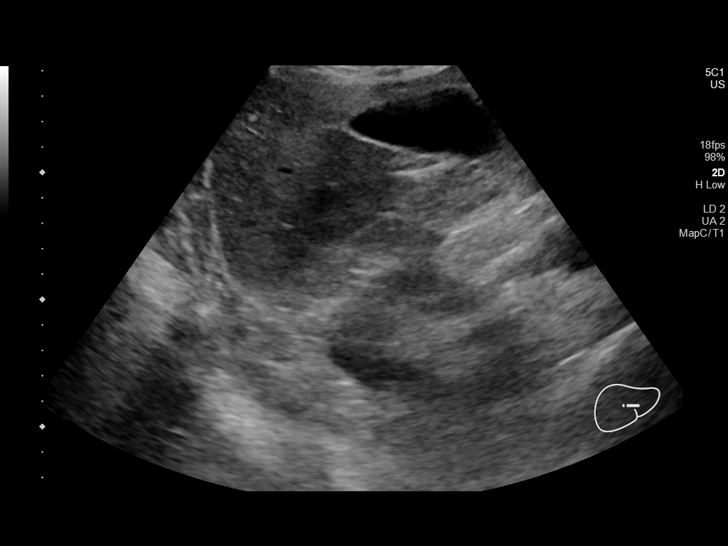
[im 49/97]
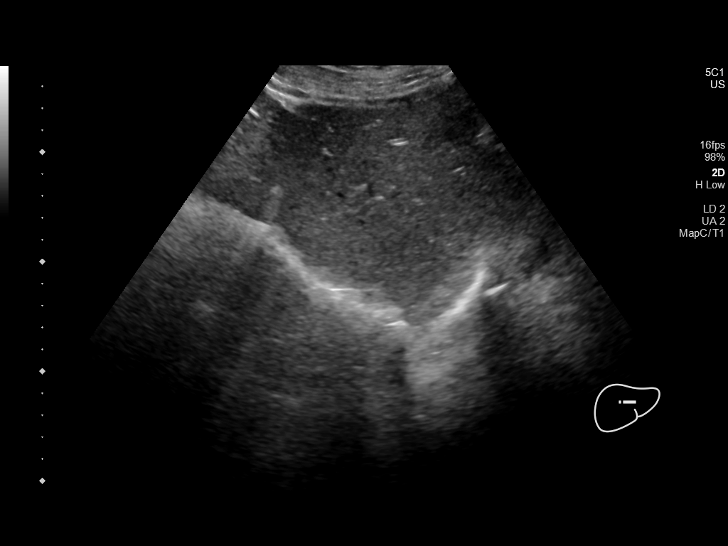
[im 57/97]
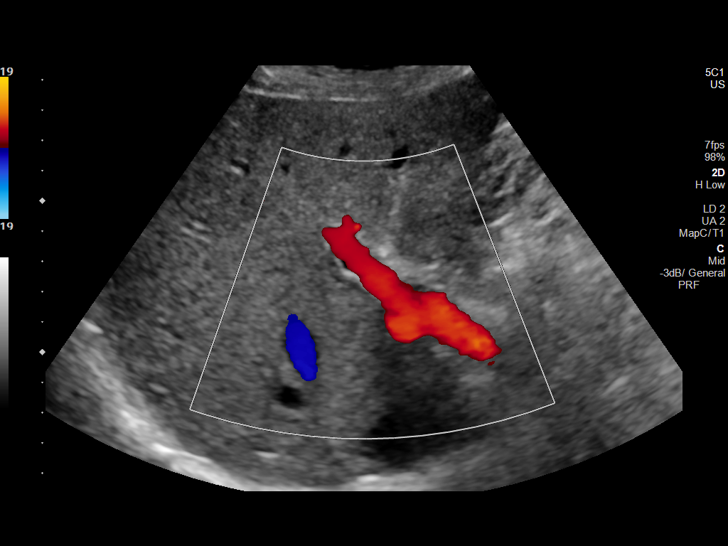
[im 65/97]
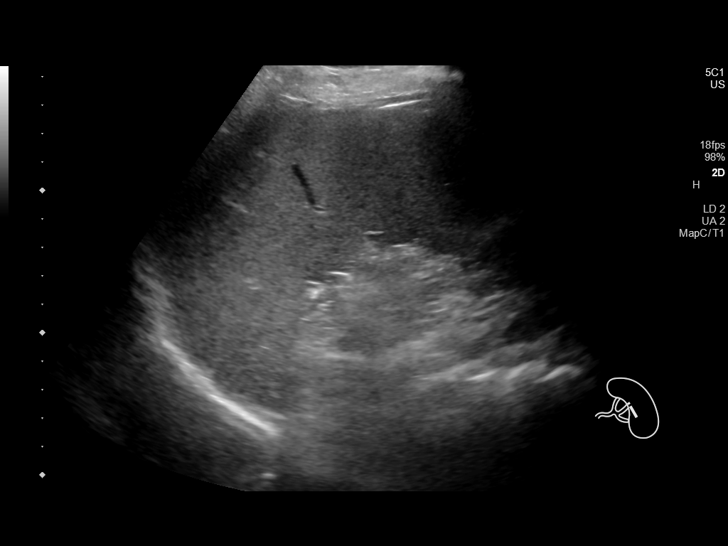
[im 73/97]
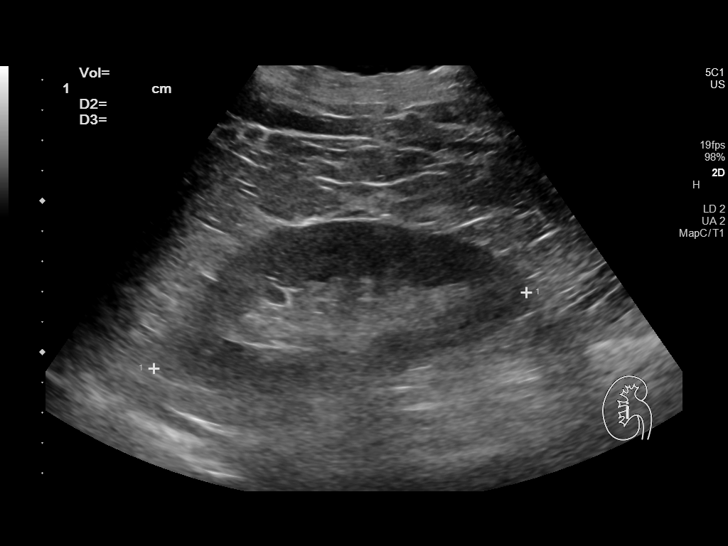
[im 81/97]
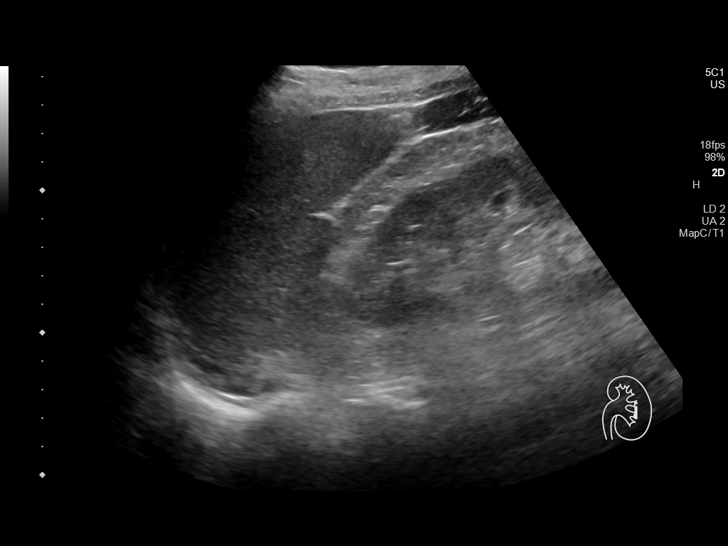
[im 89/97]
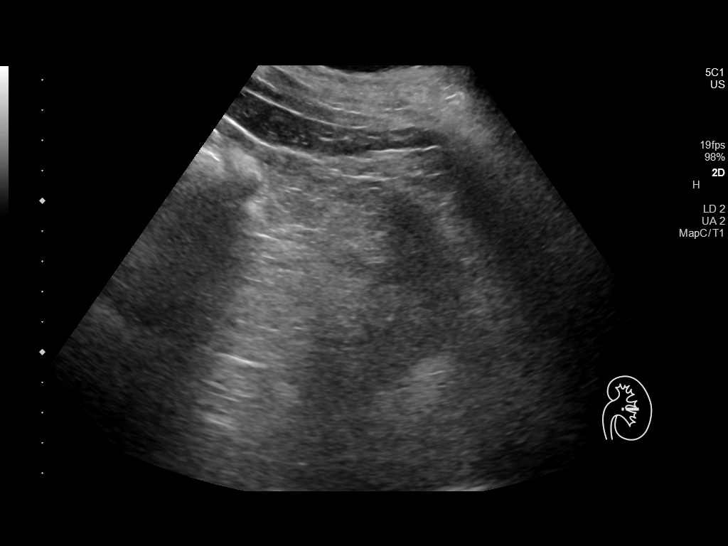
[im 97/97]
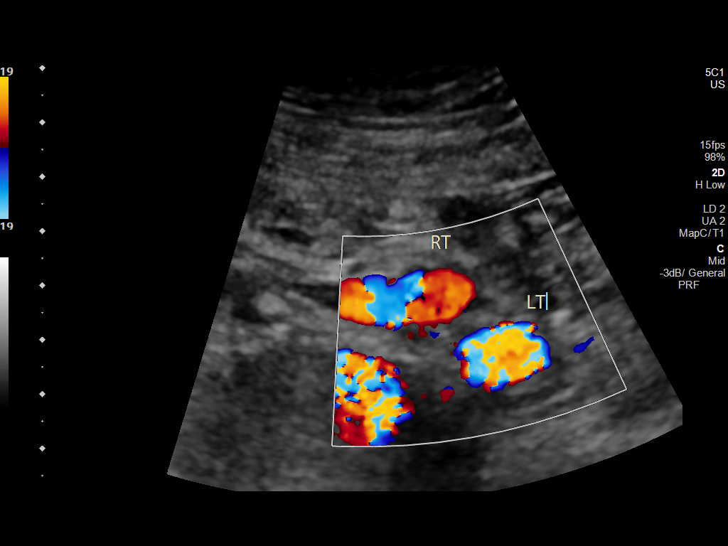

[13 of 25 positions shown; findings below may reference images not displayed]

FINDINGS: Gallbladder: No gallstones observed. Several small gallbladder
polyps are present, the largest 0.6 cm in diameter. No sonographic
Murphy sign noted by sonographer.

Common bile duct: Diameter: 0.3 cm

Liver: No focal lesion identified. Within normal limits in
parenchymal echogenicity. Portal vein is patent on color Doppler
imaging with normal direction of blood flow towards the liver.

IVC: No abnormality visualized.

Pancreas: Visualized portion unremarkable.

Spleen: Size and appearance within normal limits.

Right Kidney: Length: 12.6 cm. Echogenicity within normal limits. No
mass or hydronephrosis visualized.

Left Kidney: Length: 13.2 cm. Echogenicity within normal limits. No
mass or hydronephrosis visualized.

Abdominal aorta: No aneurysm visualized.

Other findings: None.
IMPRESSION: 1. Several small polyps in the gallbladder, largest 0.6 cm in
diameter. Based on current ACR recommendations, polyps equal to or
below 6 mm in diameter do not require further follow up. Reference:
Managing incidental findings on abdominal and pelvic CT and MRI,
Part 4: white paper of the ACR Incidental Findings Committee II on
gallbladder and biliary findings. [HOSPITAL]. 1934;10 (12):
953-6. doi:10.1016/j.jacr.1934.05.022

## 2020-12-01 NOTE — Telephone Encounter (Signed)
Scheduled per 2/3 los. Called pt and left a msg 

## 2020-12-03 ENCOUNTER — Telehealth: Payer: Self-pay | Admitting: *Deleted

## 2020-12-03 ENCOUNTER — Other Ambulatory Visit: Payer: Self-pay | Admitting: *Deleted

## 2020-12-03 DIAGNOSIS — C9210 Chronic myeloid leukemia, BCR/ABL-positive, not having achieved remission: Secondary | ICD-10-CM

## 2020-12-03 LAB — BCR/ABL

## 2020-12-03 MED ORDER — DASATINIB 100 MG PO TABS: 100.0000 mg | ORAL_TABLET | Freq: Every day | ORAL | 3 refills | Status: DC

## 2020-12-03 NOTE — Telephone Encounter (Signed)
-----   Message from Orson Slick, MD sent at 12/03/2020 11:12 AM EST ----- Please let Allen Adams know that his BCR/ABL was detected at 0.0059%. This is slightly higher than his last measurement, which was undetectable (0.000%). This is still an excellent response. We will continue his therapy and check him back in 3 months time.   ----- Message ----- From: Buel Ream, Lab In [place] Sent: 11/27/2020  12:44 PM EST To: Orson Slick, MD

## 2020-12-03 NOTE — Telephone Encounter (Signed)
TCT patient regarding recent lab results. No answer but was able to leave vm mesage for pt on an identified phone line. Advised that BCR/ABL was very slightly elevated. Dr. Lorenso Courier recommends to continue on current therapy.  Advised that Sprycel refill has been sent in to [facility].

## 2020-12-22 DIAGNOSIS — G4733 Obstructive sleep apnea (adult) (pediatric): Secondary | ICD-10-CM | POA: Diagnosis not present

## 2021-02-05 DIAGNOSIS — G4733 Obstructive sleep apnea (adult) (pediatric): Secondary | ICD-10-CM | POA: Diagnosis not present

## 2021-02-05 DIAGNOSIS — R0689 Other abnormalities of breathing: Secondary | ICD-10-CM | POA: Diagnosis not present

## 2021-02-26 ENCOUNTER — Encounter: Payer: Self-pay | Admitting: Hematology and Oncology

## 2021-02-26 ENCOUNTER — Other Ambulatory Visit: Payer: Self-pay | Admitting: *Deleted

## 2021-02-26 ENCOUNTER — Inpatient Hospital Stay: Payer: BC Managed Care – PPO

## 2021-02-26 ENCOUNTER — Inpatient Hospital Stay: Payer: BC Managed Care – PPO | Attending: Hematology and Oncology | Admitting: Hematology and Oncology

## 2021-02-26 ENCOUNTER — Other Ambulatory Visit: Payer: Self-pay

## 2021-02-26 ENCOUNTER — Other Ambulatory Visit: Payer: Self-pay | Admitting: Hematology and Oncology

## 2021-02-26 VITALS — BP 138/96 | HR 95 | Temp 97.2°F | Resp 20 | Ht 76.0 in | Wt 249.2 lb

## 2021-02-26 DIAGNOSIS — Z79899 Other long term (current) drug therapy: Secondary | ICD-10-CM | POA: Insufficient documentation

## 2021-02-26 DIAGNOSIS — C9210 Chronic myeloid leukemia, BCR/ABL-positive, not having achieved remission: Secondary | ICD-10-CM | POA: Insufficient documentation

## 2021-02-26 DIAGNOSIS — Z823 Family history of stroke: Secondary | ICD-10-CM | POA: Diagnosis not present

## 2021-02-26 DIAGNOSIS — Z8249 Family history of ischemic heart disease and other diseases of the circulatory system: Secondary | ICD-10-CM | POA: Diagnosis not present

## 2021-02-26 DIAGNOSIS — Z809 Family history of malignant neoplasm, unspecified: Secondary | ICD-10-CM | POA: Insufficient documentation

## 2021-02-26 LAB — CBC WITH DIFFERENTIAL (CANCER CENTER ONLY)
Abs Immature Granulocytes: 0.02 10*3/uL (ref 0.00–0.07)
Basophils Absolute: 0.1 10*3/uL (ref 0.0–0.1)
Basophils Relative: 1 %
Eosinophils Absolute: 0.1 10*3/uL (ref 0.0–0.5)
Eosinophils Relative: 2 %
HCT: 43.8 % (ref 39.0–52.0)
Hemoglobin: 14.7 g/dL (ref 13.0–17.0)
Immature Granulocytes: 1 %
Lymphocytes Relative: 29 %
Lymphs Abs: 1.2 10*3/uL (ref 0.7–4.0)
MCH: 30.3 pg (ref 26.0–34.0)
MCHC: 33.6 g/dL (ref 30.0–36.0)
MCV: 90.3 fL (ref 80.0–100.0)
Monocytes Absolute: 0.4 10*3/uL (ref 0.1–1.0)
Monocytes Relative: 9 %
Neutro Abs: 2.5 10*3/uL (ref 1.7–7.7)
Neutrophils Relative %: 58 %
Platelet Count: 165 10*3/uL (ref 150–400)
RBC: 4.85 MIL/uL (ref 4.22–5.81)
RDW: 13.9 % (ref 11.5–15.5)
WBC Count: 4.3 10*3/uL (ref 4.0–10.5)
nRBC: 0 % (ref 0.0–0.2)

## 2021-02-26 LAB — CMP (CANCER CENTER ONLY)
ALT: 16 U/L (ref 0–44)
AST: 20 U/L (ref 15–41)
Albumin: 4.4 g/dL (ref 3.5–5.0)
Alkaline Phosphatase: 66 U/L (ref 38–126)
Anion gap: 8 (ref 5–15)
BUN: 16 mg/dL (ref 6–20)
CO2: 25 mmol/L (ref 22–32)
Calcium: 8.7 mg/dL — ABNORMAL LOW (ref 8.9–10.3)
Chloride: 106 mmol/L (ref 98–111)
Creatinine: 1.38 mg/dL — ABNORMAL HIGH (ref 0.61–1.24)
GFR, Estimated: >60 mL/min (ref >60)
Glucose, Bld: 99 mg/dL (ref 70–99)
Potassium: 4 mmol/L (ref 3.5–5.1)
Sodium: 139 mmol/L (ref 135–145)
Total Bilirubin: 0.6 mg/dL (ref 0.3–1.2)
Total Protein: 6.7 g/dL (ref 6.5–8.1)

## 2021-02-26 LAB — LACTATE DEHYDROGENASE: LDH: 188 U/L (ref 98–192)

## 2021-02-26 MED ORDER — DASATINIB 100 MG PO TABS: 100.0000 mg | ORAL_TABLET | Freq: Every day | ORAL | 3 refills | Status: DC

## 2021-02-26 NOTE — Progress Notes (Signed)
[place] [phone]   [phone]) [phone]  PROGRESS NOTE  Patient Care Team: Harlan Stains, MD as PCP - General (Family Medicine)  Hematological/Oncological History # Chronic Myeloid Leukemia, Chronic Phase 1) 06/04/2016: WBC count 15.5, ANC 12.9 2) 01/17/2020: WBC 18.9, Hgb 16.2, Plt 537, MCV 86.7 3) 02/01/2020: establish care with Dr. Lindi Adie. Flow cytometry showed no abnormalities. JAK2 negative. BCR/ABL PCR showed 90.67% positive nuclei for the fusion. Diagnosed with CML 4) 02/21/2020: establish care with Dr. Lorenso Courier 5) 03/05/2020: Bone marrow biopsy performed, findings consistent with CML. Started Dasatinib $RemoveBeforeDEI'100mg'eHlpQXftqhoDLsJi$  PO daily.  6) 04/03/2020: complete hematological response with normalization of blood counts.  7) 05/30/2020: BCR/ABL PCR shows undetectable levels 8) 08/29/2020: BCR/ABL PCR shows undetectable levels 9) 11/27/2020: BCR/ABL PCR shows 0.0059%   Interval History:  Allen Adams 55 y.o. male with medical history significant for CML presents for a follow up visit. The patient's last visit was on 11/27/2020 at which time he was found to have a complete molecular response to treatment. In the interim since the last visit he has been well with no major changes in his health.   On exam today Allen Adams reports he has been well in the interim since her last visit.  His right hand has been healing well and he does report some occasional cramping in it.  He notes that his pills are not causing any nausea vomiting and is not having any further issues with diarrhea or loose stools.  He denies have any missed doses.  He reports having good levels of energy and has no additional questions comments or concerns today.  He otherwise denies any fevers, chills, sweats.  A full 10 point ROS is listed below.  He is tolerating his dasatinib therapy without any complication is willing and able to continue this medication.   MEDICAL HISTORY:  Past Medical History:  Diagnosis Date   . Anxiety   . Atrial fibrillation ([place])   . Hypertriglyceridemia   . OSA (obstructive sleep apnea)    (SPLIT 03/05/11, ESS 6, AHI 41/hr REM 90/hr, RDI 83/hr REM 120/hr, O2 nadir 83%; CPAP 9 with AHI 0/hr), Dr Maxwell Caul    SURGICAL HISTORY: Past Surgical History:  Procedure Laterality Date  . Lasik like eye surgery, PPK    . lumbar back surgery      SOCIAL HISTORY: Social History   Socioeconomic History  . Marital status: Married    Spouse name: Not on file  . Number of children: Not on file  . Years of education: Not on file  . Highest education level: Not on file  Occupational History  . Not on file  Tobacco Use  . Smoking status: Never Smoker  . Smokeless tobacco: Never Used  Substance and Sexual Activity  . Alcohol use: Not on file  . Drug use: Not on file  . Sexual activity: Not on file  Other Topics Concern  . Not on file  Social History Narrative  . Not on file   Social Determinants of Health   Financial Resource Strain: Not on file  Food Insecurity: Not on file  Transportation Needs: Not on file  Physical Activity: Not on file  Stress: Not on file  Social Connections: Not on file  Intimate Partner Violence: Not on file    FAMILY HISTORY: Family History  Problem Relation Age of Onset  . Stroke Mother   . Cancer Father   . Heart attack Maternal Grandfather     ALLERGIES:  has No  Known Allergies.  MEDICATIONS:  Current Outpatient Medications  Medication Sig Dispense Refill  . aspirin 81 MG tablet Take 81 mg by mouth daily.    . Aspirin-Calcium Carbonate 81-777 MG TABS Take by mouth.    . dasatinib (SPRYCEL) 100 MG tablet Take 1 tablet (100 mg total) by mouth daily. 30 tablet 3  . Omega-3 Fatty Acids (FISH OIL PO) Take 4 capsules by mouth daily.    Marland Kitchen VITAMIN D, CHOLECALCIFEROL, PO Take 1 tablet by mouth daily.     No current facility-administered medications for this visit.    REVIEW OF SYSTEMS:   Constitutional: ( - ) fevers, ( - )  chills , (  - ) night sweats Eyes: ( - ) blurriness of vision, ( - ) double vision, ( - ) watery eyes Ears, nose, mouth, throat, and face: ( - ) mucositis, ( - ) sore throat Respiratory: ( - ) cough, ( - ) dyspnea, ( - ) wheezes Cardiovascular: ( - ) palpitation, ( - ) chest discomfort, ( - ) lower extremity swelling Gastrointestinal:  ( - ) nausea, ( - ) heartburn, ( - ) change in bowel habits Skin: ( - ) abnormal skin rashes Lymphatics: ( - ) new lymphadenopathy, ( - ) easy bruising Neurological: ( - ) numbness, ( - ) tingling, ( - ) new weaknesses Behavioral/Psych: ( - ) mood change, ( - ) new changes  All other systems were reviewed with the patient and are negative.  PHYSICAL EXAMINATION: ECOG PERFORMANCE STATUS: 0 - Asymptomatic  Vitals:   02/26/21 0828  BP: (!) 138/96  Pulse: 95  Resp: 20  Temp: (!) 97.2 F (36.2 C)  SpO2: 100%   Filed Weights   02/26/21 0828  Weight: 249 lb 3.2 oz (113 kg)    GENERAL: well appearing middle aged Caucasian male. Alert, no distress and comfortable SKIN: skin color, texture, turgor are normal, no rashes or significant lesions EYES: conjunctiva are pink and non-injected, sclera clear LUNGS: clear to auscultation and percussion with normal breathing effort HEART: regular rate & rhythm and no murmurs and no lower extremity edema Musculoskeletal: no cyanosis of digits and no clubbing. Large feet  PSYCH: alert & oriented x 3, fluent speech NEURO: no focal motor/sensory deficits  LABORATORY DATA:  I have reviewed the data as listed CBC Latest Ref Rng & Units 02/26/2021 11/27/2020 09/02/2020  WBC 4.0 - 10.5 K/uL 4.3 5.4 5.7  Hemoglobin 13.0 - 17.0 g/dL 14.7 14.6 14.1  Hematocrit 39.0 - 52.0 % 43.8 44.8 41.5  Platelets 150 - 400 K/uL 165 180 181    CMP Latest Ref Rng & Units 11/27/2020 09/02/2020 05/30/2020  Glucose 70 - 99 mg/dL 99 94 129(H)  BUN 6 - 20 mg/dL $Remove'18 16 17  'LpyfsFx$ Creatinine 0.61 - 1.24 mg/dL 1.39(H) 1.27(H) 1.30(H)  Sodium 135 - 145 mmol/L 141 140 140   Potassium 3.5 - 5.1 mmol/L 4.4 3.9 3.9  Chloride 98 - 111 mmol/L 107 106 108  CO2 22 - 32 mmol/L 26 26 21(L)  Calcium 8.9 - 10.3 mg/dL 8.9 8.9 9.5  Total Protein 6.5 - 8.1 g/dL 7.0 7.0 7.0  Total Bilirubin 0.3 - 1.2 mg/dL 0.7 0.7 0.5  Alkaline Phos 38 - 126 U/L 65 68 94  AST 15 - 41 U/L $Remo'20 21 23  'MZcGD$ ALT 0 - 44 U/L 21 24 37    RADIOGRAPHIC STUDIES: No results found.  ASSESSMENT & PLAN Allen Adams 55 y.o. male with medical history significant for  CML presents for a follow up visit.  After review of the labs, and discussion with the patient it is apparent that the patient is currently in a complete molecular response and has a white blood cell count that returned to normal.  His next BCR/ABL PCR was ordered today to assure the levels remain undetectable.    On exam today Allen Adams reports he is at his baseline level of health. Overall we will plan to continue dasatinib at the current dose and continue to monitor closely.   Current plan is for continued dasatinib 100mg  PO daily. His start date was 03/05/2020. This is the first line of therapy for this patient.   #CML, Chronic Phase --patient has had a complete molecular response, with undetectable/marked low at target BCR/ABL on PCR.  --patient is tolerating dasatnib 100mg  PO daily with no concerning symptoms at this time. Continue at the current dosage --will assess BCR/ABL PCR q 3 months (today) to determine his molecular response.  --RTC in 12 weeks   #Loose Stools, resolved --occasional, will continue to monitor  --none reported at today's visit  #Leukopenia, resolved --after 4 weeks of treatment patient had an ANC 1.7, WBC 3.3 --rebounded to normal levels, remain WNL today.  --no infectious symptoms, otherwise asymptomatic.   No orders of the defined types were placed in this encounter.   All questions were answered. The patient knows to call the clinic with any problems, questions or concerns.  A total of more than 30  minutes were spent on this encounter and over half of that time was spent on counseling and coordination of care as outlined above.   Allen Peoples, MD Department of Hematology/Oncology [place] [facility] Phone: [phone] Pager: [phone] Email: [email or web address]  02/26/2021 8:48 AM

## 2021-03-02 ENCOUNTER — Telehealth: Payer: Self-pay | Admitting: Hematology and Oncology

## 2021-03-02 NOTE — Telephone Encounter (Signed)
Scheduled per los. Called and left msg. Mailed printout  °

## 2021-03-04 LAB — BCR/ABL

## 2021-03-07 DIAGNOSIS — G4733 Obstructive sleep apnea (adult) (pediatric): Secondary | ICD-10-CM | POA: Diagnosis not present

## 2021-03-07 DIAGNOSIS — R0689 Other abnormalities of breathing: Secondary | ICD-10-CM | POA: Diagnosis not present

## 2021-03-10 ENCOUNTER — Telehealth: Payer: Self-pay | Admitting: *Deleted

## 2021-03-10 NOTE — Telephone Encounter (Signed)
-----   Message from Orson Slick, MD sent at 03/10/2021  9:25 AM EDT ----- Please let Mr. Allen Adams know that his BCR/ABL remains virtually undetectable at this time. We will continue dasatinib at his current dose and see him back in 3 months time.  ----- Message ----- From: Buel Ream, Lab In [place] Sent: 02/26/2021   8:28 AM EDT To: Orson Slick, MD

## 2021-03-10 NOTE — Telephone Encounter (Signed)
TCT patient regarding recent lab results. No answer but was able to leave detailed message regarding good lab results on his identified phone # Advised that he could call back if needed @ [phone]

## 2021-04-07 DIAGNOSIS — R0689 Other abnormalities of breathing: Secondary | ICD-10-CM | POA: Diagnosis not present

## 2021-04-07 DIAGNOSIS — G4733 Obstructive sleep apnea (adult) (pediatric): Secondary | ICD-10-CM | POA: Diagnosis not present

## 2021-04-13 DIAGNOSIS — G4733 Obstructive sleep apnea (adult) (pediatric): Secondary | ICD-10-CM | POA: Diagnosis not present

## 2021-05-07 DIAGNOSIS — G4733 Obstructive sleep apnea (adult) (pediatric): Secondary | ICD-10-CM | POA: Diagnosis not present

## 2021-05-07 DIAGNOSIS — R0689 Other abnormalities of breathing: Secondary | ICD-10-CM | POA: Diagnosis not present

## 2021-05-28 ENCOUNTER — Other Ambulatory Visit: Payer: Self-pay

## 2021-05-28 ENCOUNTER — Inpatient Hospital Stay: Payer: BC Managed Care – PPO

## 2021-05-28 ENCOUNTER — Inpatient Hospital Stay: Payer: BC Managed Care – PPO | Attending: Hematology and Oncology | Admitting: Hematology and Oncology

## 2021-05-28 VITALS — BP 144/89 | HR 91 | Temp 99.1°F | Resp 18 | Wt 246.1 lb

## 2021-05-28 DIAGNOSIS — Z79899 Other long term (current) drug therapy: Secondary | ICD-10-CM | POA: Insufficient documentation

## 2021-05-28 DIAGNOSIS — Z823 Family history of stroke: Secondary | ICD-10-CM | POA: Insufficient documentation

## 2021-05-28 DIAGNOSIS — Z809 Family history of malignant neoplasm, unspecified: Secondary | ICD-10-CM | POA: Insufficient documentation

## 2021-05-28 DIAGNOSIS — C9210 Chronic myeloid leukemia, BCR/ABL-positive, not having achieved remission: Secondary | ICD-10-CM

## 2021-05-28 DIAGNOSIS — Z8249 Family history of ischemic heart disease and other diseases of the circulatory system: Secondary | ICD-10-CM | POA: Insufficient documentation

## 2021-05-28 LAB — CBC WITH DIFFERENTIAL (CANCER CENTER ONLY)
Abs Immature Granulocytes: 0.02 10*3/uL (ref 0.00–0.07)
Basophils Absolute: 0 10*3/uL (ref 0.0–0.1)
Basophils Relative: 1 %
Eosinophils Absolute: 0.2 10*3/uL (ref 0.0–0.5)
Eosinophils Relative: 3 %
HCT: 41.5 % (ref 39.0–52.0)
Hemoglobin: 14.2 g/dL (ref 13.0–17.0)
Immature Granulocytes: 0 %
Lymphocytes Relative: 27 %
Lymphs Abs: 1.3 10*3/uL (ref 0.7–4.0)
MCH: 30.7 pg (ref 26.0–34.0)
MCHC: 34.2 g/dL (ref 30.0–36.0)
MCV: 89.6 fL (ref 80.0–100.0)
Monocytes Absolute: 0.4 10*3/uL (ref 0.1–1.0)
Monocytes Relative: 10 %
Neutro Abs: 2.7 10*3/uL (ref 1.7–7.7)
Neutrophils Relative %: 59 %
Platelet Count: 211 10*3/uL (ref 150–400)
RBC: 4.63 MIL/uL (ref 4.22–5.81)
RDW: 13.9 % (ref 11.5–15.5)
WBC Count: 4.7 10*3/uL (ref 4.0–10.5)
nRBC: 0 % (ref 0.0–0.2)

## 2021-05-28 LAB — CMP (CANCER CENTER ONLY)
ALT: 15 U/L (ref 0–44)
AST: 17 U/L (ref 15–41)
Albumin: 4.2 g/dL (ref 3.5–5.0)
Alkaline Phosphatase: 74 U/L (ref 38–126)
Anion gap: 10 (ref 5–15)
BUN: 16 mg/dL (ref 6–20)
CO2: 22 mmol/L (ref 22–32)
Calcium: 9.4 mg/dL (ref 8.9–10.3)
Chloride: 109 mmol/L (ref 98–111)
Creatinine: 1.26 mg/dL — ABNORMAL HIGH (ref 0.61–1.24)
GFR, Estimated: >60 mL/min (ref >60)
Glucose, Bld: 101 mg/dL — ABNORMAL HIGH (ref 70–99)
Potassium: 4.1 mmol/L (ref 3.5–5.1)
Sodium: 141 mmol/L (ref 135–145)
Total Bilirubin: 0.5 mg/dL (ref 0.3–1.2)
Total Protein: 7 g/dL (ref 6.5–8.1)

## 2021-05-28 LAB — LACTATE DEHYDROGENASE: LDH: 218 U/L — ABNORMAL HIGH (ref 98–192)

## 2021-05-28 MED ORDER — DASATINIB 100 MG PO TABS: 100.0000 mg | ORAL_TABLET | Freq: Every day | ORAL | 3 refills | Status: DC
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
## 2021-06-04 ENCOUNTER — Telehealth: Payer: Self-pay | Admitting: *Deleted

## 2021-06-04 LAB — BCR/ABL

## 2021-06-04 NOTE — Telephone Encounter (Signed)
TCT patient regarding recent lab results.Allen Adams with his wife and let her know that his BCR/ABL remains very low and at target. He is to continue his medication and we will see him back in 3 months. His wife said she would give  him the message

## 2021-06-04 NOTE — Telephone Encounter (Signed)
-----   Message from Orson Slick, MD sent at 06/04/2021  1:37 PM EDT ----- Please let Mr. Hillebrand know that his BCR/ABL PCR was still very low and at target (measured at 0.0009). We will continue dasatinib at his current dose and plan to see him back in 3 months time ----- Message ----- From: Interface, Lab In [place] Sent: 05/28/2021   9:56 AM EDT To: Orson Slick, MD

## 2021-06-07 DIAGNOSIS — G4733 Obstructive sleep apnea (adult) (pediatric): Secondary | ICD-10-CM | POA: Diagnosis not present

## 2021-06-07 DIAGNOSIS — R0689 Other abnormalities of breathing: Secondary | ICD-10-CM | POA: Diagnosis not present

## 2021-07-08 DIAGNOSIS — G4733 Obstructive sleep apnea (adult) (pediatric): Secondary | ICD-10-CM | POA: Diagnosis not present

## 2021-07-08 DIAGNOSIS — R0689 Other abnormalities of breathing: Secondary | ICD-10-CM | POA: Diagnosis not present

## 2021-08-07 DIAGNOSIS — R0689 Other abnormalities of breathing: Secondary | ICD-10-CM | POA: Diagnosis not present

## 2021-08-07 DIAGNOSIS — G4733 Obstructive sleep apnea (adult) (pediatric): Secondary | ICD-10-CM | POA: Diagnosis not present

## 2021-08-27 ENCOUNTER — Inpatient Hospital Stay: Payer: BC Managed Care – PPO

## 2021-08-27 ENCOUNTER — Inpatient Hospital Stay: Payer: BC Managed Care – PPO | Attending: Hematology and Oncology | Admitting: Hematology and Oncology

## 2021-08-27 ENCOUNTER — Other Ambulatory Visit: Payer: Self-pay

## 2021-08-27 VITALS — BP 135/72 | HR 89 | Temp 97.3°F | Resp 18 | Wt 242.6 lb

## 2021-08-27 DIAGNOSIS — C9210 Chronic myeloid leukemia, BCR/ABL-positive, not having achieved remission: Secondary | ICD-10-CM | POA: Diagnosis not present

## 2021-08-27 DIAGNOSIS — Z8249 Family history of ischemic heart disease and other diseases of the circulatory system: Secondary | ICD-10-CM | POA: Insufficient documentation

## 2021-08-27 DIAGNOSIS — Z79899 Other long term (current) drug therapy: Secondary | ICD-10-CM | POA: Insufficient documentation

## 2021-08-27 DIAGNOSIS — Z823 Family history of stroke: Secondary | ICD-10-CM | POA: Diagnosis not present

## 2021-08-27 DIAGNOSIS — Z809 Family history of malignant neoplasm, unspecified: Secondary | ICD-10-CM | POA: Insufficient documentation

## 2021-08-27 LAB — CBC WITH DIFFERENTIAL (CANCER CENTER ONLY)
Abs Immature Granulocytes: 0.02 10*3/uL (ref 0.00–0.07)
Basophils Absolute: 0.1 10*3/uL (ref 0.0–0.1)
Basophils Relative: 1 %
Eosinophils Absolute: 0.2 10*3/uL (ref 0.0–0.5)
Eosinophils Relative: 4 %
HCT: 45.7 % (ref 39.0–52.0)
Hemoglobin: 15.1 g/dL (ref 13.0–17.0)
Immature Granulocytes: 1 %
Lymphocytes Relative: 31 %
Lymphs Abs: 1.3 10*3/uL (ref 0.7–4.0)
MCH: 29.5 pg (ref 26.0–34.0)
MCHC: 33 g/dL (ref 30.0–36.0)
MCV: 89.4 fL (ref 80.0–100.0)
Monocytes Absolute: 0.3 10*3/uL (ref 0.1–1.0)
Monocytes Relative: 8 %
Neutro Abs: 2.3 10*3/uL (ref 1.7–7.7)
Neutrophils Relative %: 55 %
Platelet Count: 177 10*3/uL (ref 150–400)
RBC: 5.11 MIL/uL (ref 4.22–5.81)
RDW: 13.8 % (ref 11.5–15.5)
WBC Count: 4.2 10*3/uL (ref 4.0–10.5)
nRBC: 0 % (ref 0.0–0.2)

## 2021-08-27 LAB — CMP (CANCER CENTER ONLY)
ALT: 20 U/L (ref 0–44)
AST: 19 U/L (ref 15–41)
Albumin: 4.4 g/dL (ref 3.5–5.0)
Alkaline Phosphatase: 69 U/L (ref 38–126)
Anion gap: 11 (ref 5–15)
BUN: 18 mg/dL (ref 6–20)
CO2: 22 mmol/L (ref 22–32)
Calcium: 8.7 mg/dL — ABNORMAL LOW (ref 8.9–10.3)
Chloride: 109 mmol/L (ref 98–111)
Creatinine: 1.35 mg/dL — ABNORMAL HIGH (ref 0.61–1.24)
GFR, Estimated: >60 mL/min (ref >60)
Glucose, Bld: 106 mg/dL — ABNORMAL HIGH (ref 70–99)
Potassium: 4 mmol/L (ref 3.5–5.1)
Sodium: 142 mmol/L (ref 135–145)
Total Bilirubin: 0.6 mg/dL (ref 0.3–1.2)
Total Protein: 7 g/dL (ref 6.5–8.1)

## 2021-08-27 LAB — LACTATE DEHYDROGENASE: LDH: 192 U/L (ref 98–192)

## 2021-08-27 MED ORDER — DASATINIB 100 MG PO TABS: 100.0000 mg | ORAL_TABLET | Freq: Every day | ORAL | 3 refills | Status: DC

## 2021-08-27 NOTE — Progress Notes (Signed)
[place] Telephone:([phone]) [phone]   Fax:(336) [phone]  PROGRESS NOTE  Patient Care Team: Harlan Stains, MD as PCP - General (Family Medicine)  Hematological/Oncological History # Chronic Myeloid Leukemia, Chronic Phase 1) 06/04/2016: WBC count 15.5, ANC 12.9 2) 01/17/2020: WBC 18.9, Hgb 16.2, Plt 537, MCV 86.7 3) 02/01/2020: establish care with Dr. Lindi Adie. Flow cytometry showed no abnormalities. JAK2 negative. BCR/ABL PCR showed 90.67% positive nuclei for the fusion. Diagnosed with CML 4) 02/21/2020: establish care with Dr. Lorenso Courier 5) 03/05/2020: Bone marrow biopsy performed, findings consistent with CML. Started Dasatinib $RemoveBeforeDEI'100mg'[id number]$  PO daily.  6) 04/03/2020: complete hematological response with normalization of blood counts.  7) 05/30/2020: BCR/ABL PCR shows undetectable levels 8) 08/29/2020: BCR/ABL PCR shows undetectable levels 9) 11/27/2020: BCR/ABL PCR shows 0.0059% 10) 02/26/2021: BCR/ABL PCR shows detected below 0.002% 11) 05/28/2021: BCR/ABL PCR shows 0.0094%  Interval History:  Allen Adams 55 y.o. male with medical history significant for CML presents for a follow up visit. The patient's last visit was on 05/28/2021 at which time he was found to have a continued complete molecular response to treatment. In the interim since the last visit he has been well with no major changes in his health.   On exam today [name] reports he has tolerated the statin therapy quite well.  He has had no major changes in his health since last visit.  He reports having good levels of energy and denies any fevers, chills, sweats.  A full 10 point ROS is listed below.  He is tolerating his dasatinib therapy without any complication is willing and able to continue this medication.   MEDICAL HISTORY:  Past Medical History:  Diagnosis Date   Anxiety    Atrial fibrillation (HCC)    Hypertriglyceridemia    OSA (obstructive sleep apnea)    (SPLIT 03/05/11, ESS 6, AHI 41/hr REM 90/hr, RDI 83/hr REM  120/hr, O2 nadir 83%; CPAP 9 with AHI 0/hr), Dr Maxwell Caul    SURGICAL HISTORY: Past Surgical History:  Procedure Laterality Date   Lasik like eye surgery, PPK     lumbar back surgery      SOCIAL HISTORY: Social History   Socioeconomic History   Marital status: Married    Spouse name: Not on file   Number of children: Not on file   Years of education: Not on file   Highest education level: Not on file  Occupational History   Not on file  Tobacco Use   Smoking status: Never   Smokeless tobacco: Never  Substance and Sexual Activity   Alcohol use: Not on file   Drug use: Not on file   Sexual activity: Not on file  Other Topics Concern   Not on file  Social History Narrative   Not on file   Social Determinants of Health   Financial Resource Strain: Not on file  Food Insecurity: Not on file  Transportation Needs: Not on file  Physical Activity: Not on file  Stress: Not on file  Social Connections: Not on file  Intimate Partner Violence: Not on file    FAMILY HISTORY: Family History  Problem Relation Age of Onset   Stroke Mother    Cancer Father    Heart attack Maternal Grandfather     ALLERGIES:  has No Known Allergies.  MEDICATIONS:  Current Outpatient Medications  Medication Sig Dispense Refill   aspirin 81 MG tablet Take 81 mg by mouth daily.     Aspirin-Calcium Carbonate 81-777 MG TABS Take by mouth.  dasatinib (SPRYCEL) 100 MG tablet Take 1 tablet (100 mg total) by mouth daily. 30 tablet 3   Omega-3 Fatty Acids (FISH OIL PO) Take 4 capsules by mouth daily.     VITAMIN D, CHOLECALCIFEROL, PO Take 1 tablet by mouth daily.     No current facility-administered medications for this visit.    REVIEW OF SYSTEMS:   Constitutional: ( - ) fevers, ( - )  chills , ( - ) night sweats Eyes: ( - ) blurriness of vision, ( - ) double vision, ( - ) watery eyes Ears, nose, mouth, throat, and face: ( - ) mucositis, ( - ) sore throat Respiratory: ( - ) cough, ( - )  dyspnea, ( - ) wheezes Cardiovascular: ( - ) palpitation, ( - ) chest discomfort, ( - ) lower extremity swelling Gastrointestinal:  ( - ) nausea, ( - ) heartburn, ( - ) change in bowel habits Skin: ( - ) abnormal skin rashes Lymphatics: ( - ) new lymphadenopathy, ( - ) easy bruising Neurological: ( - ) numbness, ( - ) tingling, ( - ) new weaknesses Behavioral/Psych: ( - ) mood change, ( - ) new changes  All other systems were reviewed with the patient and are negative.  PHYSICAL EXAMINATION: ECOG PERFORMANCE STATUS: 0 - Asymptomatic  Vitals:   08/27/21 0823  BP: 135/72  Pulse: 89  Resp: 18  Temp: (!) 97.3 F (36.3 C)  SpO2: 98%   Filed Weights   08/27/21 0823  Weight: 242 lb 9.6 oz (110 kg)    GENERAL: well appearing middle aged Caucasian male. Alert, no distress and comfortable SKIN: skin color, texture, turgor are normal, no rashes or significant lesions EYES: conjunctiva are pink and non-injected, sclera clear LUNGS: clear to auscultation and percussion with normal breathing effort HEART: regular rate & rhythm and no murmurs and no lower extremity edema Musculoskeletal: no cyanosis of digits and no clubbing. Large feet  PSYCH: alert & oriented x 3, fluent speech NEURO: no focal motor/sensory deficits  LABORATORY DATA:  I have reviewed the data as listed CBC Latest Ref Rng & Units 08/27/2021 05/28/2021 02/26/2021  WBC 4.0 - 10.5 K/uL 4.2 4.7 4.3  Hemoglobin 13.0 - 17.0 g/dL 15.1 14.2 14.7  Hematocrit 39.0 - 52.0 % 45.7 41.5 43.8  Platelets 150 - 400 K/uL 177 211 165    CMP Latest Ref Rng & Units 08/27/2021 05/28/2021 02/26/2021  Glucose 70 - 99 mg/dL 106(H) 101(H) 99  BUN 6 - 20 mg/dL $Remove'18 16 16  'xdquUlV$ Creatinine 0.61 - 1.24 mg/dL 1.35(H) 1.26(H) 1.38(H)  Sodium 135 - 145 mmol/L 142 141 139  Potassium 3.5 - 5.1 mmol/L 4.0 4.1 4.0  Chloride 98 - 111 mmol/L 109 109 106  CO2 22 - 32 mmol/L $RemoveB'22 22 25  'cSlKNtqe$ Calcium 8.9 - 10.3 mg/dL 8.7(L) 9.4 8.7(L)  Total Protein 6.5 - 8.1 g/dL 7.0 7.0 6.7   Total Bilirubin 0.3 - 1.2 mg/dL 0.6 0.5 0.6  Alkaline Phos 38 - 126 U/L 69 74 66  AST 15 - 41 U/L $Remo'19 17 20  'WxiUK$ ALT 0 - 44 U/L $Remo'20 15 16    'mydKz$ RADIOGRAPHIC STUDIES: No results found.  ASSESSMENT & PLAN IVAL PACER 55 y.o. male with medical history significant for CML presents for a follow up visit.  After review of the labs, and discussion with the patient it is apparent that the patient is currently in a complete molecular response and has a white blood cell count that returned to normal.  His next BCR/ABL PCR was ordered today to assure the levels remain undetectable.    On exam today Mr. Brumett reports he is at his baseline level of health. Overall we will plan to continue dasatinib at the current dose and continue to monitor closely.   Current plan is for continued dasatinib 100mg  PO daily. His start date was 03/05/2020. This is the first line of therapy for this patient.   #CML, Chronic Phase --patient has had a complete molecular response, with undetectable/marked low at target BCR/ABL on PCR.  --patient is tolerating dasatnib 100mg  PO daily with no concerning symptoms at this time. Continue at the current dosage --will assess BCR/ABL PCR q 3 months (today) to determine his molecular response.  --RTC in 12 weeks   #Loose Stools, resolved --occasional, will continue to monitor  --he reports this occurs rarely at today's visit  #Leukopenia, resolved --after 4 weeks of treatment patient had an ANC 1.7, WBC 3.3 --rebounded to normal levels, today has WBC 4.2 with ANC 2.3 --no infectious symptoms, otherwise asymptomatic.   No orders of the defined types were placed in this encounter.   All questions were answered. The patient knows to call the clinic with any problems, questions or concerns.  A total of more than 30 minutes were spent on this encounter and over half of that time was spent on counseling and coordination of care as outlined above.   Ledell Peoples, MD Department of  Hematology/Oncology [place] at [facility] Phone: [phone] Pager: [phone] Email: [email or web address]  08/27/2021 1:59 PM

## 2021-09-02 LAB — BCR/ABL

## 2021-09-04 ENCOUNTER — Telehealth: Payer: Self-pay | Admitting: *Deleted

## 2021-09-04 NOTE — Telephone Encounter (Signed)
TCT pt regarding recent BCR/ABL. No answer but was able to leave detailed message on his identified mobile phone.. BCR/ABL remains stable @ 0.003%. He is to continue Sprycel @ 100 mg daily and we see him back here in 3 months.

## 2021-09-04 NOTE — Telephone Encounter (Signed)
-----   Message from Orson Slick, MD sent at 09/02/2021  8:51 AM EST ----- Please let Mr. Giaimo know that his BCR/ABL was measured at 0.003%. This is on target. We will continue dasatinib 100mg  PO daily and see him back in 3 months time.   ----- Message ----- From: Buel Ream, Lab In [place] Sent: 08/27/2021   8:19 AM EST To: Orson Slick, MD

## 2021-09-07 DIAGNOSIS — G4733 Obstructive sleep apnea (adult) (pediatric): Secondary | ICD-10-CM | POA: Diagnosis not present

## 2021-09-07 DIAGNOSIS — R0689 Other abnormalities of breathing: Secondary | ICD-10-CM | POA: Diagnosis not present

## 2021-10-01 DIAGNOSIS — F4322 Adjustment disorder with anxiety: Secondary | ICD-10-CM | POA: Diagnosis not present

## 2021-10-07 DIAGNOSIS — G4733 Obstructive sleep apnea (adult) (pediatric): Secondary | ICD-10-CM | POA: Diagnosis not present

## 2021-10-07 DIAGNOSIS — R0689 Other abnormalities of breathing: Secondary | ICD-10-CM | POA: Diagnosis not present

## 2021-10-08 DIAGNOSIS — F4320 Adjustment disorder, unspecified: Secondary | ICD-10-CM | POA: Diagnosis not present

## 2021-10-14 DIAGNOSIS — F4322 Adjustment disorder with anxiety: Secondary | ICD-10-CM | POA: Diagnosis not present

## 2021-11-07 DIAGNOSIS — G4733 Obstructive sleep apnea (adult) (pediatric): Secondary | ICD-10-CM | POA: Diagnosis not present

## 2021-11-07 DIAGNOSIS — R0689 Other abnormalities of breathing: Secondary | ICD-10-CM | POA: Diagnosis not present

## 2021-11-24 ENCOUNTER — Other Ambulatory Visit: Payer: Self-pay | Admitting: *Deleted

## 2021-11-24 DIAGNOSIS — C9210 Chronic myeloid leukemia, BCR/ABL-positive, not having achieved remission: Secondary | ICD-10-CM

## 2021-11-24 MED ORDER — DASATINIB 100 MG PO TABS: 100.0000 mg | ORAL_TABLET | Freq: Every day | ORAL | 3 refills | Status: DC

## 2021-11-26 ENCOUNTER — Inpatient Hospital Stay: Payer: BC Managed Care – PPO

## 2021-11-26 ENCOUNTER — Inpatient Hospital Stay: Payer: BC Managed Care – PPO | Attending: Hematology and Oncology | Admitting: Hematology and Oncology

## 2021-11-26 ENCOUNTER — Other Ambulatory Visit: Payer: Self-pay

## 2021-11-26 VITALS — BP 130/73 | HR 78 | Temp 97.6°F | Resp 18 | Ht 76.0 in | Wt 242.2 lb

## 2021-11-26 DIAGNOSIS — Z809 Family history of malignant neoplasm, unspecified: Secondary | ICD-10-CM | POA: Insufficient documentation

## 2021-11-26 DIAGNOSIS — Z79899 Other long term (current) drug therapy: Secondary | ICD-10-CM | POA: Insufficient documentation

## 2021-11-26 DIAGNOSIS — Z823 Family history of stroke: Secondary | ICD-10-CM | POA: Insufficient documentation

## 2021-11-26 DIAGNOSIS — R197 Diarrhea, unspecified: Secondary | ICD-10-CM | POA: Insufficient documentation

## 2021-11-26 DIAGNOSIS — C9210 Chronic myeloid leukemia, BCR/ABL-positive, not having achieved remission: Secondary | ICD-10-CM | POA: Insufficient documentation

## 2021-11-26 DIAGNOSIS — Z8249 Family history of ischemic heart disease and other diseases of the circulatory system: Secondary | ICD-10-CM | POA: Diagnosis not present

## 2021-11-26 LAB — CBC WITH DIFFERENTIAL (CANCER CENTER ONLY)
Abs Immature Granulocytes: 0.01 10*3/uL (ref 0.00–0.07)
Basophils Absolute: 0.1 10*3/uL (ref 0.0–0.1)
Basophils Relative: 1 %
Eosinophils Absolute: 0.1 10*3/uL (ref 0.0–0.5)
Eosinophils Relative: 2 %
HCT: 44.9 % (ref 39.0–52.0)
Hemoglobin: 14.5 g/dL (ref 13.0–17.0)
Immature Granulocytes: 0 %
Lymphocytes Relative: 32 %
Lymphs Abs: 1.4 10*3/uL (ref 0.7–4.0)
MCH: 29.6 pg (ref 26.0–34.0)
MCHC: 32.3 g/dL (ref 30.0–36.0)
MCV: 91.6 fL (ref 80.0–100.0)
Monocytes Absolute: 0.4 10*3/uL (ref 0.1–1.0)
Monocytes Relative: 9 %
Neutro Abs: 2.5 10*3/uL (ref 1.7–7.7)
Neutrophils Relative %: 56 %
Platelet Count: 171 10*3/uL (ref 150–400)
RBC: 4.9 MIL/uL (ref 4.22–5.81)
RDW: 14 % (ref 11.5–15.5)
WBC Count: 4.5 10*3/uL (ref 4.0–10.5)
nRBC: 0 % (ref 0.0–0.2)

## 2021-11-26 LAB — LACTATE DEHYDROGENASE: LDH: 183 U/L (ref 98–192)

## 2021-11-26 LAB — CMP (CANCER CENTER ONLY)
ALT: 18 U/L (ref 0–44)
AST: 17 U/L (ref 15–41)
Albumin: 4.6 g/dL (ref 3.5–5.0)
Alkaline Phosphatase: 69 U/L (ref 38–126)
Anion gap: 6 (ref 5–15)
BUN: 18 mg/dL (ref 6–20)
CO2: 26 mmol/L (ref 22–32)
Calcium: 8.9 mg/dL (ref 8.9–10.3)
Chloride: 107 mmol/L (ref 98–111)
Creatinine: 1.33 mg/dL — ABNORMAL HIGH (ref 0.61–1.24)
GFR, Estimated: >60 mL/min (ref >60)
Glucose, Bld: 104 mg/dL — ABNORMAL HIGH (ref 70–99)
Potassium: 4.1 mmol/L (ref 3.5–5.1)
Sodium: 139 mmol/L (ref 135–145)
Total Bilirubin: 0.6 mg/dL (ref 0.3–1.2)
Total Protein: 6.9 g/dL (ref 6.5–8.1)

## 2021-11-26 MED ORDER — DASATINIB 100 MG PO TABS: 100.0000 mg | ORAL_TABLET | Freq: Every day | ORAL | 3 refills | Status: DC

## 2021-11-28 NOTE — Progress Notes (Signed)
[place] Telephone:(336) [phone]   Fax:([phone]  PROGRESS NOTE  Patient Care Team: Harlan Stains, MD as PCP - General (Family Medicine)  Hematological/Oncological History # Chronic Myeloid Leukemia, Chronic Phase 1) 06/04/2016: WBC count 15.5, ANC 12.9 2) 01/17/2020: WBC 18.9, Hgb 16.2, Plt 537, MCV 86.7 3) 02/01/2020: establish care with Dr. Lindi Adie. Flow cytometry showed no abnormalities. JAK2 negative. BCR/ABL PCR showed 90.67% positive nuclei for the fusion. Diagnosed with CML 4) 02/21/2020: establish care with Dr. Lorenso Courier 5) 03/05/2020: Bone marrow biopsy performed, findings consistent with CML. Started Dasatinib $RemoveBeforeDEI'100mg'[id number]$  PO daily.  6) 04/03/2020: complete hematological response with normalization of blood counts.  7) 05/30/2020: BCR/ABL PCR shows undetectable levels 8) 08/29/2020: BCR/ABL PCR shows undetectable levels 9) 11/27/2020: BCR/ABL PCR shows 0.0059% 10) 02/26/2021: BCR/ABL PCR shows detected below 0.002% 11) 05/28/2021: BCR/ABL PCR shows 0.0094%  Interval History:  Allen Adams 56 y.o. male with medical history significant for CML presents for a follow up visit. The patient's last visit was on 08/27/2021 at which time he was found to have a continued complete molecular response to treatment. In the interim since the last visit he has been well with no major changes in his health.   On exam today Mr. Allen Adams reports he has tolerated the sprycel therapy well.  He reports that he is not having any side effects as a result of the medication.  He reports that his weight has been stable and his energy levels have been good.  He does have occasional loose stools but these have been rare and manageable.  He reports having good levels of energy and denies any fevers, chills, sweats.  A full 10 point ROS is listed below.  He is willing and able to continue this medication.   MEDICAL HISTORY:  Past Medical History:  Diagnosis Date   Anxiety    Atrial fibrillation (HCC)     Hypertriglyceridemia    OSA (obstructive sleep apnea)    (SPLIT 03/05/11, ESS 6, AHI 41/hr REM 90/hr, RDI 83/hr REM 120/hr, O2 nadir 83%; CPAP 9 with AHI 0/hr), Dr Maxwell Caul    SURGICAL HISTORY: Past Surgical History:  Procedure Laterality Date   Lasik like eye surgery, PPK     lumbar back surgery      SOCIAL HISTORY: Social History   Socioeconomic History   Marital status: Married    Spouse name: Not on file   Number of children: Not on file   Years of education: Not on file   Highest education level: Not on file  Occupational History   Not on file  Tobacco Use   Smoking status: Never   Smokeless tobacco: Never  Substance and Sexual Activity   Alcohol use: Not on file   Drug use: Not on file   Sexual activity: Not on file  Other Topics Concern   Not on file  Social History Narrative   Not on file   Social Determinants of Health   Financial Resource Strain: Not on file  Food Insecurity: Not on file  Transportation Needs: Not on file  Physical Activity: Not on file  Stress: Not on file  Social Connections: Not on file  Intimate Partner Violence: Not on file    FAMILY HISTORY: Family History  Problem Relation Age of Onset   Stroke Mother    Cancer Father    Heart attack Maternal Grandfather     ALLERGIES:  has No Known Allergies.  MEDICATIONS:  Current Outpatient Medications  Medication Sig Dispense  Refill   aspirin 81 MG tablet Take 81 mg by mouth daily.     Aspirin-Calcium Carbonate 81-777 MG TABS Take by mouth.     dasatinib (SPRYCEL) 100 MG tablet Take 1 tablet (100 mg total) by mouth daily. 30 tablet 3   Omega-3 Fatty Acids (FISH OIL PO) Take 4 capsules by mouth daily.     VITAMIN D, CHOLECALCIFEROL, PO Take 1 tablet by mouth daily.     No current facility-administered medications for this visit.    REVIEW OF SYSTEMS:   Constitutional: ( - ) fevers, ( - )  chills , ( - ) night sweats Eyes: ( - ) blurriness of vision, ( - ) double vision, ( - )  watery eyes Ears, nose, mouth, throat, and face: ( - ) mucositis, ( - ) sore throat Respiratory: ( - ) cough, ( - ) dyspnea, ( - ) wheezes Cardiovascular: ( - ) palpitation, ( - ) chest discomfort, ( - ) lower extremity swelling Gastrointestinal:  ( - ) nausea, ( - ) heartburn, ( - ) change in bowel habits Skin: ( - ) abnormal skin rashes Lymphatics: ( - ) new lymphadenopathy, ( - ) easy bruising Neurological: ( - ) numbness, ( - ) tingling, ( - ) new weaknesses Behavioral/Psych: ( - ) mood change, ( - ) new changes  All other systems were reviewed with the patient and are negative.  PHYSICAL EXAMINATION: ECOG PERFORMANCE STATUS: 0 - Asymptomatic  Vitals:   11/26/21 0942  BP: 130/73  Pulse: 78  Resp: 18  Temp: 97.6 F (36.4 C)  SpO2: 100%   Filed Weights   11/26/21 0942  Weight: 242 lb 3.2 oz (109.9 kg)    GENERAL: well appearing middle aged Caucasian male. Alert, no distress and comfortable SKIN: skin color, texture, turgor are normal, no rashes or significant lesions EYES: conjunctiva are pink and non-injected, sclera clear LUNGS: clear to auscultation and percussion with normal breathing effort HEART: regular rate & rhythm and no murmurs and no lower extremity edema Musculoskeletal: no cyanosis of digits and no clubbing. Large feet  PSYCH: alert & oriented x 3, fluent speech NEURO: no focal motor/sensory deficits  LABORATORY DATA:  I have reviewed the data as listed CBC Latest Ref Rng & Units 11/26/2021 08/27/2021 05/28/2021  WBC 4.0 - 10.5 K/uL 4.5 4.2 4.7  Hemoglobin 13.0 - 17.0 g/dL 14.5 15.1 14.2  Hematocrit 39.0 - 52.0 % 44.9 45.7 41.5  Platelets 150 - 400 K/uL 171 177 211    CMP Latest Ref Rng & Units 11/26/2021 08/27/2021 05/28/2021  Glucose 70 - 99 mg/dL 104(H) 106(H) 101(H)  BUN 6 - 20 mg/dL $Remove'18 18 16  'APNkyaD$ Creatinine 0.61 - 1.24 mg/dL 1.33(H) 1.35(H) 1.26(H)  Sodium 135 - 145 mmol/L 139 142 141  Potassium 3.5 - 5.1 mmol/L 4.1 4.0 4.1  Chloride 98 - 111 mmol/L 107 109  109  CO2 22 - 32 mmol/L $RemoveB'26 22 22  'ujqzECvq$ Calcium 8.9 - 10.3 mg/dL 8.9 8.7(L) 9.4  Total Protein 6.5 - 8.1 g/dL 6.9 7.0 7.0  Total Bilirubin 0.3 - 1.2 mg/dL 0.6 0.6 0.5  Alkaline Phos 38 - 126 U/L 69 69 74  AST 15 - 41 U/L $Remo'17 19 17  'ahlGk$ ALT 0 - 44 U/L $Remo'18 20 15    'EDLlO$ RADIOGRAPHIC STUDIES: No results found.  ASSESSMENT & PLAN VOSHON PETRO 56 y.o. male with medical history significant for CML presents for a follow up visit.  After review of the labs, and  discussion with the patient it is apparent that the patient is currently in a complete molecular response and has a white blood cell count that returned to normal.  His next BCR/ABL PCR was ordered today to assure the levels remain undetectable.    On exam today Allen Adams reports he is at his baseline level of health. Overall we will plan to continue dasatinib at the current dose and continue to monitor closely.   Current plan is for continued dasatinib 100mg  PO daily. His start date was 03/05/2020. This is the first line of therapy for this patient. Discontinuation of therapy can be considered in May 2024.    #CML, Chronic Phase --patient has had a complete molecular response, with undetectable/marked low at target BCR/ABL on PCR.  --patient is tolerating dasatnib 100mg  PO daily with no concerning symptoms at this time. Continue at the current dosage --will assess BCR/ABL PCR q 3 months to determine his molecular response.  --RTC in 12 weeks   #Loose Stools, resolved --occasional, will continue to monitor  --he reports this occurs rarely at today's visit  #Leukopenia, resolved --after 4 weeks of treatment patient had an ANC 1.7, WBC 3.3 --rebounded to normal levels --no infectious symptoms, otherwise asymptomatic.   No orders of the defined types were placed in this encounter.  All questions were answered. The patient knows to call the clinic with any problems, questions or concerns.  A total of more than 30 minutes were spent on this  encounter and over half of that time was spent on counseling and coordination of care as outlined above.   Ledell Peoples, MD Department of Hematology/Oncology [place] [facility] Phone: [phone] Pager: [phone] Email: [email or web address]  11/28/2021 10:36 AM

## 2021-12-01 DIAGNOSIS — Z13220 Encounter for screening for lipoid disorders: Secondary | ICD-10-CM | POA: Diagnosis not present

## 2021-12-01 DIAGNOSIS — Z0000 Encounter for general adult medical examination without abnormal findings: Secondary | ICD-10-CM | POA: Diagnosis not present

## 2021-12-01 DIAGNOSIS — Z125 Encounter for screening for malignant neoplasm of prostate: Secondary | ICD-10-CM | POA: Diagnosis not present

## 2021-12-01 DIAGNOSIS — I480 Paroxysmal atrial fibrillation: Secondary | ICD-10-CM | POA: Diagnosis not present

## 2021-12-02 ENCOUNTER — Telehealth: Payer: Self-pay | Admitting: *Deleted

## 2021-12-02 LAB — BCR/ABL

## 2021-12-02 NOTE — Telephone Encounter (Signed)
-----   Message from Orson Slick, MD sent at 12/02/2021  8:57 AM EST ----- Please let Mr. Pringle know that his BCR/ABL PCR showed undetectable levels. He is right on target. Please have him continue dasatinib as he has been. We see him back in 3 months time.   ----- Message ----- From: Buel Ream, Lab In [place] Sent: 11/26/2021   9:39 AM EST To: Orson Slick, MD

## 2021-12-02 NOTE — Telephone Encounter (Signed)
TCT patient regarding recent lab results. No answer but was able to leave message on identified vm. Advised that his BCR/ABL PCR is undetectable the patient is to continue his Sprycel and come back in 3 months. Advised that he can call back  @ [phone] if he has any questions or concerns

## 2021-12-20 DIAGNOSIS — G4733 Obstructive sleep apnea (adult) (pediatric): Secondary | ICD-10-CM | POA: Diagnosis not present

## 2022-01-01 ENCOUNTER — Telehealth: Payer: Self-pay | Admitting: Hematology and Oncology

## 2022-01-01 NOTE — Telephone Encounter (Signed)
.  Called pt per 2/1 inbasket , Patient was unavailable, a message with appt time and date was left with number on file.  ? ?

## 2022-01-17 DIAGNOSIS — G4733 Obstructive sleep apnea (adult) (pediatric): Secondary | ICD-10-CM | POA: Diagnosis not present

## 2022-02-17 DIAGNOSIS — G4733 Obstructive sleep apnea (adult) (pediatric): Secondary | ICD-10-CM | POA: Diagnosis not present

## 2022-02-25 ENCOUNTER — Inpatient Hospital Stay (HOSPITAL_BASED_OUTPATIENT_CLINIC_OR_DEPARTMENT_OTHER): Payer: BC Managed Care – PPO | Admitting: Hematology and Oncology

## 2022-02-25 ENCOUNTER — Inpatient Hospital Stay: Payer: BC Managed Care – PPO | Attending: Hematology and Oncology

## 2022-02-25 ENCOUNTER — Other Ambulatory Visit: Payer: Self-pay | Admitting: *Deleted

## 2022-02-25 VITALS — BP 155/83 | HR 88 | Temp 97.5°F | Resp 17 | Wt 236.3 lb

## 2022-02-25 DIAGNOSIS — Z823 Family history of stroke: Secondary | ICD-10-CM | POA: Diagnosis not present

## 2022-02-25 DIAGNOSIS — C9210 Chronic myeloid leukemia, BCR/ABL-positive, not having achieved remission: Secondary | ICD-10-CM | POA: Insufficient documentation

## 2022-02-25 DIAGNOSIS — Z8249 Family history of ischemic heart disease and other diseases of the circulatory system: Secondary | ICD-10-CM | POA: Diagnosis not present

## 2022-02-25 DIAGNOSIS — Z79899 Other long term (current) drug therapy: Secondary | ICD-10-CM | POA: Insufficient documentation

## 2022-02-25 DIAGNOSIS — Z809 Family history of malignant neoplasm, unspecified: Secondary | ICD-10-CM | POA: Diagnosis not present

## 2022-02-25 LAB — CMP (CANCER CENTER ONLY)
ALT: 15 U/L (ref 0–44)
AST: 16 U/L (ref 15–41)
Albumin: 4.5 g/dL (ref 3.5–5.0)
Alkaline Phosphatase: 61 U/L (ref 38–126)
Anion gap: 10 (ref 5–15)
BUN: 20 mg/dL (ref 6–20)
CO2: 25 mmol/L (ref 22–32)
Calcium: 8.8 mg/dL — ABNORMAL LOW (ref 8.9–10.3)
Chloride: 105 mmol/L (ref 98–111)
Creatinine: 1.34 mg/dL — ABNORMAL HIGH (ref 0.61–1.24)
GFR, Estimated: >60 mL/min (ref >60)
Glucose, Bld: 117 mg/dL — ABNORMAL HIGH (ref 70–99)
Potassium: 4 mmol/L (ref 3.5–5.1)
Sodium: 140 mmol/L (ref 135–145)
Total Bilirubin: 0.7 mg/dL (ref 0.3–1.2)
Total Protein: 7.2 g/dL (ref 6.5–8.1)

## 2022-02-25 LAB — CBC WITH DIFFERENTIAL (CANCER CENTER ONLY)
Abs Immature Granulocytes: 0.01 10*3/uL (ref 0.00–0.07)
Basophils Absolute: 0.1 10*3/uL (ref 0.0–0.1)
Basophils Relative: 1 %
Eosinophils Absolute: 0.1 10*3/uL (ref 0.0–0.5)
Eosinophils Relative: 1 %
HCT: 44.5 % (ref 39.0–52.0)
Hemoglobin: 14.4 g/dL (ref 13.0–17.0)
Immature Granulocytes: 0 %
Lymphocytes Relative: 30 %
Lymphs Abs: 1.5 10*3/uL (ref 0.7–4.0)
MCH: 29.6 pg (ref 26.0–34.0)
MCHC: 32.4 g/dL (ref 30.0–36.0)
MCV: 91.4 fL (ref 80.0–100.0)
Monocytes Absolute: 0.6 10*3/uL (ref 0.1–1.0)
Monocytes Relative: 13 %
Neutro Abs: 2.8 10*3/uL (ref 1.7–7.7)
Neutrophils Relative %: 55 %
Platelet Count: 173 10*3/uL (ref 150–400)
RBC: 4.87 MIL/uL (ref 4.22–5.81)
RDW: 13.8 % (ref 11.5–15.5)
WBC Count: 5 10*3/uL (ref 4.0–10.5)
nRBC: 0 % (ref 0.0–0.2)

## 2022-02-25 LAB — LACTATE DEHYDROGENASE: LDH: 186 U/L (ref 98–192)

## 2022-02-25 MED ORDER — DASATINIB 100 MG PO TABS: 100.0000 mg | ORAL_TABLET | Freq: Every day | ORAL | 3 refills | Status: DC

## 2022-02-25 NOTE — Progress Notes (Signed)
?[name] ?Telephone:([phone]   [phone] ? ?PROGRESS NOTE ? ?Patient Care Team: ?Harlan Stains, MD as PCP - General (Family Medicine) ? ?Hematological/Oncological History ?# Chronic Myeloid Leukemia, Chronic Phase ?1) 06/04/2016: WBC count 15.5, ANC 12.9 ?2) 01/17/2020: WBC 18.9, Hgb 16.2, Plt 537, MCV 86.7 ?3) 02/01/2020: establish care with Dr. Lindi Adie. Flow cytometry showed no abnormalities. JAK2 negative. BCR/ABL PCR showed 90.67% positive nuclei for the fusion. Diagnosed with CML ?4) 02/21/2020: establish care with Dr. Lorenso Courier ?5) 03/05/2020: Bone marrow biopsy performed, findings consistent with CML. Started Dasatinib $RemoveBeforeDEI'100mg'wRjQuRzSiipCoWEI$  PO daily.  ?6) 04/03/2020: complete hematological response with normalization of blood counts.  ?7) 05/30/2020: BCR/ABL PCR shows undetectable levels ?8) 08/29/2020: BCR/ABL PCR shows undetectable levels ?9) 11/27/2020: BCR/ABL PCR shows 0.0059% ?10) 02/26/2021: BCR/ABL PCR shows detected below 0.002% ?11) 05/28/2021: BCR/ABL PCR shows 0.0094% ? ?Interval History:  ?Allen Adams 56 y.o. male with medical history significant for CML presents for a follow up visit. The patient's last visit was on 11/26/21 at which time he was found to have a continued complete molecular response to treatment. In the interim since the last visit he has been well with no major changes in his health.  ? ?On exam today Allen Adams reports he has been well in the interim since our last visit. He did take the Shingles vaccine in April 2023 and did have some body aches and felt like he was "moving in slow motion". Otherwise he has continued the dasatinib therapy and has had no issues with the medication. He denies any shortness of breath or cough. No nausea, vomiting, or diarrhea. He reports having good levels of energy and denies any fevers, chills, sweats.  A full 10 point ROS is listed below.  He is willing and able to continue this medication. ? ? ?MEDICAL HISTORY:  ?Past Medical History:   ?Diagnosis Date  ? Anxiety   ? Atrial fibrillation ([place])   ? Hypertriglyceridemia   ? OSA (obstructive sleep apnea)   ? (SPLIT 03/05/11, ESS 6, AHI 41/hr REM 90/hr, RDI 83/hr REM 120/hr, O2 nadir 83%; CPAP 9 with AHI 0/hr), Dr Maxwell Caul  ? ? ?SURGICAL HISTORY: ?Past Surgical History:  ?Procedure Laterality Date  ? Lasik like eye surgery, PPK    ? lumbar back surgery    ? ? ?SOCIAL HISTORY: ?Social History  ? ?Socioeconomic History  ? Marital status: Married  ?  Spouse name: Not on file  ? Number of children: Not on file  ? Years of education: Not on file  ? Highest education level: Not on file  ?Occupational History  ? Not on file  ?Tobacco Use  ? Smoking status: Never  ? Smokeless tobacco: Never  ?Substance and Sexual Activity  ? Alcohol use: Not on file  ? Drug use: Not on file  ? Sexual activity: Not on file  ?Other Topics Concern  ? Not on file  ?Social History Narrative  ? Not on file  ? ?Social Determinants of Health  ? ?Financial Resource Strain: Not on file  ?Food Insecurity: Not on file  ?Transportation Needs: Not on file  ?Physical Activity: Not on file  ?Stress: Not on file  ?Social Connections: Not on file  ?Intimate Partner Violence: Not on file  ? ? ?FAMILY HISTORY: ?Family History  ?Problem Relation Age of Onset  ? Stroke Mother   ? Cancer Father   ? Heart attack Maternal Grandfather   ? ? ?ALLERGIES:  has No Known Allergies. ? ?MEDICATIONS:  ?  Current Outpatient Medications  ?Medication Sig Dispense Refill  ? aspirin 81 MG tablet Take 81 mg by mouth daily.    ? Aspirin-Calcium Carbonate 81-777 MG TABS Take by mouth.    ? dasatinib (SPRYCEL) 100 MG tablet Take 1 tablet (100 mg total) by mouth daily. 30 tablet 3  ? Omega-3 Fatty Acids (FISH OIL PO) Take 4 capsules by mouth daily.    ? VITAMIN D, CHOLECALCIFEROL, PO Take 1 tablet by mouth daily.    ? ?No current facility-administered medications for this visit.  ? ? ?REVIEW OF SYSTEMS:   ?Constitutional: ( - ) fevers, ( - )  chills , ( - ) night  sweats ?Eyes: ( - ) blurriness of vision, ( - ) double vision, ( - ) watery eyes ?Ears, nose, mouth, throat, and face: ( - ) mucositis, ( - ) sore throat ?Respiratory: ( - ) cough, ( - ) dyspnea, ( - ) wheezes ?Cardiovascular: ( - ) palpitation, ( - ) chest discomfort, ( - ) lower extremity swelling ?Gastrointestinal:  ( - ) nausea, ( - ) heartburn, ( - ) change in bowel habits ?Skin: ( - ) abnormal skin rashes ?Lymphatics: ( - ) new lymphadenopathy, ( - ) easy bruising ?Neurological: ( - ) numbness, ( - ) tingling, ( - ) new weaknesses ?Behavioral/Psych: ( - ) mood change, ( - ) new changes  ?All other systems were reviewed with the patient and are negative. ? ?PHYSICAL EXAMINATION: ?ECOG PERFORMANCE STATUS: 0 - Asymptomatic ? ?Vitals:  ? 02/25/22 0957  ?BP: (!) 155/83  ?Pulse: 88  ?Resp: 17  ?Temp: (!) 97.5 ?F (36.4 ?C)  ?SpO2: 99%  ? ?Filed Weights  ? 02/25/22 0957  ?Weight: 236 lb 4.8 oz (107.2 kg)  ? ? ?GENERAL: well appearing middle aged Caucasian male. Alert, no distress and comfortable ?SKIN: skin color, texture, turgor are normal, no rashes or significant lesions ?EYES: conjunctiva are pink and non-injected, sclera clear ?LUNGS: clear to auscultation and percussion with normal breathing effort ?HEART: regular rate & rhythm and no murmurs and no lower extremity edema ?Musculoskeletal: no cyanosis of digits and no clubbing. Large feet  ?PSYCH: alert & oriented x 3, fluent speech ?NEURO: no focal motor/sensory deficits ? ?LABORATORY DATA:  ?I have reviewed the data as listed ? ?  Latest Ref Rng & Units 02/25/2022  ?  9:49 AM 11/26/2021  ?  9:32 AM 08/27/2021  ?  8:05 AM  ?CBC  ?WBC 4.0 - 10.5 K/uL 5.0   4.5   4.2    ?Hemoglobin 13.0 - 17.0 g/dL 14.4   14.5   15.1    ?Hematocrit 39.0 - 52.0 % 44.5   44.9   45.7    ?Platelets 150 - 400 K/uL 173   171   177    ? ? ? ?  Latest Ref Rng & Units 02/25/2022  ?  9:49 AM 11/26/2021  ?  9:32 AM 08/27/2021  ?  8:05 AM  ?CMP  ?Glucose 70 - 99 mg/dL 117   104   106    ?BUN 6 - 20  mg/dL $Remo'20   18   18    'fSrxy$ ?Creatinine 0.61 - 1.24 mg/dL 1.34   1.33   1.35    ?Sodium 135 - 145 mmol/L 140   139   142    ?Potassium 3.5 - 5.1 mmol/L 4.0   4.1   4.0    ?Chloride 98 - 111 mmol/L 105   107  109    ?CO2 22 - 32 mmol/L 25   26   22     ?Calcium 8.9 - 10.3 mg/dL 8.8   8.9   8.7    ?Total Protein 6.5 - 8.1 g/dL 7.2   6.9   7.0    ?Total Bilirubin 0.3 - 1.2 mg/dL 0.7   0.6   0.6    ?Alkaline Phos 38 - 126 U/L 61   69   69    ?AST 15 - 41 U/L 16   17   19     ?ALT 0 - 44 U/L 15   18   20     ? ? ?RADIOGRAPHIC STUDIES: ?No results found. ? ?ASSESSMENT & PLAN ?DEMONTEZ NOVACK 56 y.o. male with medical history significant for CML presents for a follow up visit.  After review of the labs, and discussion with the patient it is apparent that the patient is currently in a complete molecular response and has a white blood cell count that returned to normal.  His next BCR/ABL PCR was ordered today to assure the levels remain undetectable.   ? ?On exam today Mr. Rout reports he is at his baseline level of health. Overall we will plan to continue dasatinib at the current dose and continue to monitor closely.  ? ?Current plan is for continued dasatinib 100mg  PO daily. His start date was 03/05/2020. This is the first line of therapy for this patient. Discontinuation of therapy can be considered in May 2024.   ? ?#CML, Chronic Phase ?--patient has had a complete molecular response, with undetectable/marked low at target BCR/ABL on PCR.  ?--patient is tolerating dasatnib 100mg  PO daily with no concerning symptoms at this time. Continue at the current dosage ?--will assess BCR/ABL PCR q 3 months to determine his molecular response.  ?--after 3 years of treatment (May 2024) can discuss risks/benefits of d/c of treatment.  ?--RTC in 12 weeks  ? ?#Loose Stools, resolved ?--occasional, will continue to monitor  ?--he reports this occurs rarely at today's visit ? ?#Leukopenia, resolved ?--after 4 weeks of treatment patient had an  ANC 1.7, WBC 3.3 ?--rebounded to normal levels ?--no infectious symptoms, otherwise asymptomatic.  ? ?No orders of the defined types were placed in this encounter. ? ?All questions were answered. The patient knows to call

## 2022-03-03 ENCOUNTER — Telehealth: Payer: Self-pay | Admitting: *Deleted

## 2022-03-03 LAB — BCR/ABL

## 2022-03-03 NOTE — Telephone Encounter (Signed)
TCT patient regarding recent lab results. No answer but was able to leave detailed message on his identified home answering machine. ?Advised that his BCR/ABL is undetectable and that he is doing great. His next appt is in 3 months ?

## 2022-03-03 NOTE — Telephone Encounter (Signed)
-----   Message from Orson Slick, MD sent at 03/03/2022 10:28 AM EDT ----- ?Please let Allen Adams know that his BCR/ABL test showed undetectable levels. He continues to do great. We will see him back in 3 months.  ? ?----- Message ----- ?From: Interface, Lab In [place] ?Sent: 02/25/2022   9:54 AM EDT ?To: Orson Slick, MD ? ? ?

## 2022-03-20 DIAGNOSIS — G4733 Obstructive sleep apnea (adult) (pediatric): Secondary | ICD-10-CM | POA: Diagnosis not present

## 2022-04-09 DIAGNOSIS — H21233 Degeneration of iris (pigmentary), bilateral: Secondary | ICD-10-CM | POA: Diagnosis not present

## 2022-04-20 DIAGNOSIS — G4733 Obstructive sleep apnea (adult) (pediatric): Secondary | ICD-10-CM | POA: Diagnosis not present

## 2022-05-20 DIAGNOSIS — G4733 Obstructive sleep apnea (adult) (pediatric): Secondary | ICD-10-CM | POA: Diagnosis not present

## 2022-05-27 ENCOUNTER — Inpatient Hospital Stay: Payer: BC Managed Care – PPO | Attending: Hematology and Oncology

## 2022-05-27 ENCOUNTER — Other Ambulatory Visit: Payer: Self-pay | Admitting: Hematology and Oncology

## 2022-05-27 ENCOUNTER — Inpatient Hospital Stay (HOSPITAL_BASED_OUTPATIENT_CLINIC_OR_DEPARTMENT_OTHER): Payer: BC Managed Care – PPO | Admitting: Hematology and Oncology

## 2022-05-27 ENCOUNTER — Other Ambulatory Visit: Payer: Self-pay

## 2022-05-27 VITALS — BP 135/76 | HR 96 | Temp 97.7°F | Resp 20 | Wt 238.3 lb

## 2022-05-27 DIAGNOSIS — C9210 Chronic myeloid leukemia, BCR/ABL-positive, not having achieved remission: Secondary | ICD-10-CM

## 2022-05-27 DIAGNOSIS — Z8249 Family history of ischemic heart disease and other diseases of the circulatory system: Secondary | ICD-10-CM | POA: Diagnosis not present

## 2022-05-27 DIAGNOSIS — Z79899 Other long term (current) drug therapy: Secondary | ICD-10-CM | POA: Insufficient documentation

## 2022-05-27 DIAGNOSIS — Z809 Family history of malignant neoplasm, unspecified: Secondary | ICD-10-CM | POA: Insufficient documentation

## 2022-05-27 DIAGNOSIS — D72829 Elevated white blood cell count, unspecified: Secondary | ICD-10-CM | POA: Insufficient documentation

## 2022-05-27 DIAGNOSIS — Z823 Family history of stroke: Secondary | ICD-10-CM | POA: Diagnosis not present

## 2022-05-27 LAB — CMP (CANCER CENTER ONLY)
ALT: 28 U/L (ref 0–44)
AST: 26 U/L (ref 15–41)
Albumin: 4.4 g/dL (ref 3.5–5.0)
Alkaline Phosphatase: 55 U/L (ref 38–126)
Anion gap: 9 (ref 5–15)
BUN: 26 mg/dL — ABNORMAL HIGH (ref 6–20)
CO2: 24 mmol/L (ref 22–32)
Calcium: 8.7 mg/dL — ABNORMAL LOW (ref 8.9–10.3)
Chloride: 104 mmol/L (ref 98–111)
Creatinine: 1.51 mg/dL — ABNORMAL HIGH (ref 0.61–1.24)
GFR, Estimated: 54 mL/min — ABNORMAL LOW (ref >60)
Glucose, Bld: 120 mg/dL — ABNORMAL HIGH (ref 70–99)
Potassium: 3.9 mmol/L (ref 3.5–5.1)
Sodium: 137 mmol/L (ref 135–145)
Total Bilirubin: 1.1 mg/dL (ref 0.3–1.2)
Total Protein: 7 g/dL (ref 6.5–8.1)

## 2022-05-27 LAB — CBC WITH DIFFERENTIAL (CANCER CENTER ONLY)
Abs Immature Granulocytes: 0.04 10*3/uL (ref 0.00–0.07)
Basophils Absolute: 0 10*3/uL (ref 0.0–0.1)
Basophils Relative: 0 %
Eosinophils Absolute: 0 10*3/uL (ref 0.0–0.5)
Eosinophils Relative: 0 %
HCT: 42.1 % (ref 39.0–52.0)
Hemoglobin: 13.9 g/dL (ref 13.0–17.0)
Immature Granulocytes: 0 %
Lymphocytes Relative: 7 %
Lymphs Abs: 0.8 10*3/uL (ref 0.7–4.0)
MCH: 29.8 pg (ref 26.0–34.0)
MCHC: 33 g/dL (ref 30.0–36.0)
MCV: 90.1 fL (ref 80.0–100.0)
Monocytes Absolute: 0.3 10*3/uL (ref 0.1–1.0)
Monocytes Relative: 2 %
Neutro Abs: 10.2 10*3/uL — ABNORMAL HIGH (ref 1.7–7.7)
Neutrophils Relative %: 91 %
Platelet Count: 173 10*3/uL (ref 150–400)
RBC: 4.67 MIL/uL (ref 4.22–5.81)
RDW: 14.7 % (ref 11.5–15.5)
WBC Count: 11.4 10*3/uL — ABNORMAL HIGH (ref 4.0–10.5)
nRBC: 0 % (ref 0.0–0.2)

## 2022-05-27 MED ORDER — DASATINIB 100 MG PO TABS: 100.0000 mg | ORAL_TABLET | Freq: Every day | ORAL | 3 refills | Status: DC

## 2022-05-27 NOTE — Progress Notes (Signed)
[place] Telephone:(336) [phone]   Fax:(336) [phone]  PROGRESS NOTE  Patient Care Team: Harlan Stains, MD as PCP - General (Family Medicine)  Hematological/Oncological History # Chronic Myeloid Leukemia, Chronic Phase 1) 06/04/2016: WBC count 15.5, ANC 12.9 2) 01/17/2020: WBC 18.9, Hgb 16.2, Plt 537, MCV 86.7 3) 02/01/2020: establish care with Dr. Lindi Adie. Flow cytometry showed no abnormalities. JAK2 negative. BCR/ABL PCR showed 90.67% positive nuclei for the fusion. Diagnosed with CML 4) 02/21/2020: establish care with Dr. Lorenso Courier 5) 03/05/2020: Bone marrow biopsy performed, findings consistent with CML. Started Dasatinib 11m PO daily.  6) 04/03/2020: complete hematological response with normalization of blood counts.  7) 05/30/2020: BCR/ABL PCR shows undetectable levels 8) 08/29/2020: BCR/ABL PCR shows undetectable levels 9) 11/27/2020: BCR/ABL PCR shows 0.0059% 10) 02/26/2021: BCR/ABL PCR shows detected below 0.002% 11) 05/28/2021: BCR/ABL PCR shows 0.0094% 12) 02/25/2022: BCR/ABL PCR shows undetectable levels  Interval History:  BJAKSEN FIORELLA56y.o. male with medical history significant for CML presents for a follow up visit. The patient's last visit was on 11/26/21 at which time he was found to have a continued complete molecular response to treatment. In the interim since the last visit he has been well with no major changes in his health.   On exam today Mr. WPartchreports he celebrated his birthday on 05/15/2022.  He reports was a low-key birthday and he did receive [employer] and bought a new car.  The car is bright red and has a flash license plate.  He reports that his weight is been stable and his appetite has been quite good.  He has not been having any trouble with shortness of breath, cough, or chest pain.  He has however developed some sinus pressure and drainage just began yesterday.  He notes he has been doing his best to try to keep up with hydration.  He notes he  is tolerating the Sprycel quite well without any side effects that he can note.  He reports having good levels of energy and denies any fevers, chills, sweats.  A full 10 point ROS is listed below.  He is willing and able to continue this medication.   MEDICAL HISTORY:  Past Medical History:  Diagnosis Date   Anxiety    Atrial fibrillation (HCC)    Hypertriglyceridemia    OSA (obstructive sleep apnea)    (SPLIT 03/05/11, ESS 6, AHI 41/hr REM 90/hr, RDI 83/hr REM 120/hr, O2 nadir 83%; CPAP 9 with AHI 0/hr), Dr OMaxwell Caul   SURGICAL HISTORY: Past Surgical History:  Procedure Laterality Date   Lasik like eye surgery, PPK     lumbar back surgery      SOCIAL HISTORY: Social History   Socioeconomic History   Marital status: Married    Spouse name: Not on file   Number of children: Not on file   Years of education: Not on file   Highest education level: Not on file  Occupational History   Not on file  Tobacco Use   Smoking status: Never   Smokeless tobacco: Never  Substance and Sexual Activity   Alcohol use: Not on file   Drug use: Not on file   Sexual activity: Not on file  Other Topics Concern   Not on file  Social History Narrative   Not on file   Social Determinants of Health   Financial Resource Strain: Not on file  Food Insecurity: Not on file  Transportation Needs: Not on file  Physical Activity: Not  on file  Stress: Not on file  Social Connections: Not on file  Intimate Partner Violence: Not on file    FAMILY HISTORY: Family History  Problem Relation Age of Onset   Stroke Mother    Cancer Father    Heart attack Maternal Grandfather     ALLERGIES:  has No Known Allergies.  MEDICATIONS:  Current Outpatient Medications  Medication Sig Dispense Refill   aspirin 81 MG tablet Take 81 mg by mouth daily.     dasatinib (SPRYCEL) 100 MG tablet Take 1 tablet (100 mg total) by mouth daily. 30 tablet 3   No current facility-administered medications for this  visit.    REVIEW OF SYSTEMS:   Constitutional: ( - ) fevers, ( - )  chills , ( - ) night sweats Eyes: ( - ) blurriness of vision, ( - ) double vision, ( - ) watery eyes Ears, nose, mouth, throat, and face: ( - ) mucositis, ( - ) sore throat Respiratory: ( - ) cough, ( - ) dyspnea, ( - ) wheezes Cardiovascular: ( - ) palpitation, ( - ) chest discomfort, ( - ) lower extremity swelling Gastrointestinal:  ( - ) nausea, ( - ) heartburn, ( - ) change in bowel habits Skin: ( - ) abnormal skin rashes Lymphatics: ( - ) new lymphadenopathy, ( - ) easy bruising Neurological: ( - ) numbness, ( - ) tingling, ( - ) new weaknesses Behavioral/Psych: ( - ) mood change, ( - ) new changes  All other systems were reviewed with the patient and are negative.  PHYSICAL EXAMINATION: ECOG PERFORMANCE STATUS: 0 - Asymptomatic  Vitals:   05/27/22 1020  BP: 135/76  Pulse: 96  Resp: 20  Temp: 97.7 F (36.5 C)  SpO2: 99%   Filed Weights   05/27/22 1020  Weight: 238 lb 4.8 oz (108.1 kg)    GENERAL: well appearing middle aged Caucasian male. Alert, no distress and comfortable SKIN: skin color, texture, turgor are normal, no rashes or significant lesions EYES: conjunctiva are pink and non-injected, sclera clear LUNGS: clear to auscultation and percussion with normal breathing effort HEART: regular rate & rhythm and no murmurs and no lower extremity edema Musculoskeletal: no cyanosis of digits and no clubbing. Large feet  PSYCH: alert & oriented x 3, fluent speech NEURO: no focal motor/sensory deficits  LABORATORY DATA:  I have reviewed the data as listed    Latest Ref Rng & Units 05/27/2022   10:05 AM 02/25/2022    9:49 AM 11/26/2021    9:32 AM  CBC  WBC 4.0 - 10.5 K/uL 11.4  5.0  4.5   Hemoglobin 13.0 - 17.0 g/dL 13.9  14.4  14.5   Hematocrit 39.0 - 52.0 % 42.1  44.5  44.9   Platelets 150 - 400 K/uL 173  173  171        Latest Ref Rng & Units 05/27/2022   10:05 AM 02/25/2022    9:49 AM 11/26/2021     9:32 AM  CMP  Glucose 70 - 99 mg/dL 120  117  104   BUN 6 - 20 mg/dL _0 Creatinine 0.61 - 1.24 mg/dL 1.51  1.34  1.33   Sodium 135 - 145 mmol/L 137  140  139   Potassium 3.5 - 5.1 mmol/L 3.9  4.0  4.1   Chloride 98 - 111 mmol/L 104  105  107   CO2 22 - 32 mmol/L 24  25  26   Calcium 8.9 - 10.3 mg/dL 8.7  8.8  8.9   Total Protein 6.5 - 8.1 g/dL 7.0  7.2  6.9   Total Bilirubin 0.3 - 1.2 mg/dL 1.1  0.7  0.6   Alkaline Phos 38 - 126 U/L 55  61  69   AST 15 - 41 U/L _0 ALT 0 - 44 U/L _1 RADIOGRAPHIC STUDIES: No results found.  ASSESSMENT & PLAN AARONMICHAEL BRUMBAUGH 56 y.o. male with medical history significant for CML presents for a follow up visit.  After review of the labs, and discussion with the patient it is apparent that the patient is currently in a complete molecular response and has a white blood cell count that returned to normal.  His next BCR/ABL PCR was ordered today to assure the levels remain undetectable.    On exam today Mr. Brazie reports he is at his baseline level of health. Overall we will plan to continue dasatinib at the current dose and continue to monitor closely.   Current plan is for continued dasatinib 176m PO daily. His start date was 03/05/2020. This is the first line of therapy for this patient. Discontinuation of therapy can be considered in May 2024.    #CML, Chronic Phase --patient has had a complete molecular response, with undetectable/marked low at target BCR/ABL on PCR.  --patient is tolerating dasatnib 1045mPO daily with no concerning symptoms at this time. Continue at the current dosage --will assess BCR/ABL PCR q 3 months to determine his molecular response.  --Labs today show white blood cell, 0.4, hemoglobin 30.9, MCV 90.1, and platelets of 173 --after 3 years of treatment (May 2024) can discuss risks/benefits of d/c of treatment.  --RTC in 12 weeks   #Loose Stools, resolved --occasional, will continue to monitor   --he reports this occurs rarely at today's visit  #Leukocytosis -- White blood cell count today 1168.1ith neutrophilic predominance. --Findings are most consistent with infection given his recent sinus issues. --Recommend he call usKoreaf he has persistent symptoms or failure of his sinus pressure to resolved. --Continue to monitor  No orders of the defined types were placed in this encounter.  All questions were answered. The patient knows to call the clinic with any problems, questions or concerns.  A total of more than 30 minutes were spent on this encounter and over half of that time was spent on counseling and coordination of care as outlined above.   JoLedell PeoplesMD Department of Hematology/Oncology CoBrambletont WePsa [facility] Surgical Center Of Austinhone: [phone]: [phone]: [email or web address]  05/27/2022 5:09 PM

## 2022-05-31 DIAGNOSIS — H18513 Endothelial corneal dystrophy, bilateral: Secondary | ICD-10-CM | POA: Diagnosis not present

## 2022-06-07 ENCOUNTER — Telehealth: Payer: Self-pay | Admitting: *Deleted

## 2022-06-07 NOTE — Telephone Encounter (Signed)
Received call  from pt requesting that his future appts be moved to Thursdays. This works better for him as far as his work and also getting his labs done.. Advised that we can do this without any problerms. Scheduling message sent ot change his appts to Thursdays.

## 2022-06-17 LAB — BCR/ABL

## 2022-06-18 DIAGNOSIS — G4733 Obstructive sleep apnea (adult) (pediatric): Secondary | ICD-10-CM | POA: Diagnosis not present

## 2022-06-22 DIAGNOSIS — G4733 Obstructive sleep apnea (adult) (pediatric): Secondary | ICD-10-CM | POA: Diagnosis not present

## 2022-06-25 DIAGNOSIS — H21233 Degeneration of iris (pigmentary), bilateral: Secondary | ICD-10-CM | POA: Diagnosis not present

## 2022-07-01 ENCOUNTER — Telehealth: Payer: Self-pay | Admitting: *Deleted

## 2022-07-01 NOTE — Telephone Encounter (Signed)
TCT patient regarding BCR/ABL results. Spoke with him and advised that they remain undetectable. Pt is to continue with his current treatment. Pt asked if his future appts could be moved to Thursdays-mid morning. Advised that I will send a scheduling message to change those appt dates and times. Pt voiced understanding to all of the above.

## 2022-07-19 DIAGNOSIS — G4733 Obstructive sleep apnea (adult) (pediatric): Secondary | ICD-10-CM | POA: Diagnosis not present

## 2022-08-18 DIAGNOSIS — G4733 Obstructive sleep apnea (adult) (pediatric): Secondary | ICD-10-CM | POA: Diagnosis not present

## 2022-08-26 ENCOUNTER — Inpatient Hospital Stay: Payer: BC Managed Care – PPO | Attending: Hematology and Oncology

## 2022-08-26 ENCOUNTER — Inpatient Hospital Stay (HOSPITAL_BASED_OUTPATIENT_CLINIC_OR_DEPARTMENT_OTHER): Payer: BC Managed Care – PPO | Admitting: Hematology and Oncology

## 2022-08-26 VITALS — BP 128/93 | HR 92 | Temp 98.3°F | Resp 18 | Wt 240.5 lb

## 2022-08-26 DIAGNOSIS — Z79899 Other long term (current) drug therapy: Secondary | ICD-10-CM | POA: Insufficient documentation

## 2022-08-26 DIAGNOSIS — C9210 Chronic myeloid leukemia, BCR/ABL-positive, not having achieved remission: Secondary | ICD-10-CM

## 2022-08-26 DIAGNOSIS — Z823 Family history of stroke: Secondary | ICD-10-CM | POA: Insufficient documentation

## 2022-08-26 DIAGNOSIS — Z8249 Family history of ischemic heart disease and other diseases of the circulatory system: Secondary | ICD-10-CM | POA: Diagnosis not present

## 2022-08-26 DIAGNOSIS — R6883 Chills (without fever): Secondary | ICD-10-CM | POA: Insufficient documentation

## 2022-08-26 DIAGNOSIS — Z79624 Long term (current) use of inhibitors of nucleotide synthesis: Secondary | ICD-10-CM | POA: Insufficient documentation

## 2022-08-26 DIAGNOSIS — R21 Rash and other nonspecific skin eruption: Secondary | ICD-10-CM | POA: Diagnosis not present

## 2022-08-26 DIAGNOSIS — Z809 Family history of malignant neoplasm, unspecified: Secondary | ICD-10-CM | POA: Diagnosis not present

## 2022-08-26 LAB — CBC WITH DIFFERENTIAL (CANCER CENTER ONLY)
Abs Immature Granulocytes: 0.13 10*3/uL — ABNORMAL HIGH (ref 0.00–0.07)
Basophils Absolute: 0.1 10*3/uL (ref 0.0–0.1)
Basophils Relative: 1 %
Eosinophils Absolute: 0.1 10*3/uL (ref 0.0–0.5)
Eosinophils Relative: 1 %
HCT: 44.5 % (ref 39.0–52.0)
Hemoglobin: 14.7 g/dL (ref 13.0–17.0)
Immature Granulocytes: 2 %
Lymphocytes Relative: 28 %
Lymphs Abs: 1.7 10*3/uL (ref 0.7–4.0)
MCH: 30.2 pg (ref 26.0–34.0)
MCHC: 33 g/dL (ref 30.0–36.0)
MCV: 91.4 fL (ref 80.0–100.0)
Monocytes Absolute: 0.4 10*3/uL (ref 0.1–1.0)
Monocytes Relative: 8 %
Neutro Abs: 3.5 10*3/uL (ref 1.7–7.7)
Neutrophils Relative %: 60 %
Platelet Count: 303 10*3/uL (ref 150–400)
RBC: 4.87 MIL/uL (ref 4.22–5.81)
RDW: 14.1 % (ref 11.5–15.5)
WBC Count: 5.9 10*3/uL (ref 4.0–10.5)
nRBC: 0 % (ref 0.0–0.2)

## 2022-08-26 LAB — CMP (CANCER CENTER ONLY)
ALT: 31 U/L (ref 0–44)
AST: 20 U/L (ref 15–41)
Albumin: 4.2 g/dL (ref 3.5–5.0)
Alkaline Phosphatase: 59 U/L (ref 38–126)
Anion gap: 7 (ref 5–15)
BUN: 16 mg/dL (ref 6–20)
CO2: 25 mmol/L (ref 22–32)
Calcium: 8.7 mg/dL — ABNORMAL LOW (ref 8.9–10.3)
Chloride: 106 mmol/L (ref 98–111)
Creatinine: 1.3 mg/dL — ABNORMAL HIGH (ref 0.61–1.24)
GFR, Estimated: >60 mL/min (ref >60)
Glucose, Bld: 99 mg/dL (ref 70–99)
Potassium: 4.3 mmol/L (ref 3.5–5.1)
Sodium: 138 mmol/L (ref 135–145)
Total Bilirubin: 0.5 mg/dL (ref 0.3–1.2)
Total Protein: 6.9 g/dL (ref 6.5–8.1)

## 2022-08-26 NOTE — Progress Notes (Signed)
[place] Telephone:([phone]   Fax:(336) [phone]  PROGRESS NOTE  Patient Care Team: Harlan Stains, MD as PCP - General (Family Medicine)  Hematological/Oncological History # Chronic Myeloid Leukemia, Chronic Phase 1) 06/04/2016: WBC count 15.5, ANC 12.9 2) 01/17/2020: WBC 18.9, Hgb 16.2, Plt 537, MCV 86.7 3) 02/01/2020: establish care with Dr. Lindi Adie. Flow cytometry showed no abnormalities. JAK2 negative. BCR/ABL PCR showed 90.67% positive nuclei for the fusion. Diagnosed with CML 4) 02/21/2020: establish care with Dr. Lorenso Courier 5) 03/05/2020: Bone marrow biopsy performed, findings consistent with CML. Started Dasatinib 17m PO daily.  6) 04/03/2020: complete hematological response with normalization of blood counts.  7) 05/30/2020: BCR/ABL PCR shows undetectable levels 8) 08/29/2020: BCR/ABL PCR shows undetectable levels 9) 11/27/2020: BCR/ABL PCR shows 0.0059% 10) 02/26/2021: BCR/ABL PCR shows detected below 0.002% 11) 05/28/2021: BCR/ABL PCR shows 0.0094% 12) 02/25/2022: BCR/ABL PCR shows undetectable levels 13) 05/27/2022: BCR/ABL PCR shows undetectable levels  Interval History:  Allen REICHART89y.o. male with medical history significant for CML presents for a follow up visit. The patient's last visit was on 05/27/22 at which time he was found to have a continued complete molecular response to treatment. In the interim since the last visit he has been well with no major changes in his health.   On exam today Mr. WFalzonereports he has been well overall interim since her last visit.  He did have an episode where unfortunately he felt quite bad and had chills with a temperature of 100 F.  His energy dropped and his appetite became quite poor.  He developed a rash across his abdomen.  He went to his care facility at work where his provider gave him oral antibiotics and valacyclovir.  He noted there was a low suspicion for shingles.  Fortunately with this therapy his symptoms have all  resolved.  This was approximately 2 weeks ago.  Since then he has had no issues.  He notes that he does not have any cough or shortness of breath.  His weight has been stable and his appetite has been good.  He is able to work without any difficulty.  The Sprycel is not causing him any noticeable side effects.  He reports having good levels of energy and denies any fevers, chills, sweats.  A full 10 point ROS is listed below.  He is willing and able to continue this medication.   MEDICAL HISTORY:  Past Medical History:  Diagnosis Date   Anxiety    Atrial fibrillation (HCC)    Hypertriglyceridemia    OSA (obstructive sleep apnea)    (SPLIT 03/05/11, ESS 6, AHI 41/hr REM 90/hr, RDI 83/hr REM 120/hr, O2 nadir 83%; CPAP 9 with AHI 0/hr), Dr OMaxwell Caul   SURGICAL HISTORY: Past Surgical History:  Procedure Laterality Date   Lasik like eye surgery, PPK     lumbar back surgery      SOCIAL HISTORY: Social History   Socioeconomic History   Marital status: Married    Spouse name: Not on file   Number of children: Not on file   Years of education: Not on file   Highest education level: Not on file  Occupational History   Not on file  Tobacco Use   Smoking status: Never   Smokeless tobacco: Never  Substance and Sexual Activity   Alcohol use: Not on file   Drug use: Not on file   Sexual activity: Not on file  Other Topics Concern   Not on file  Social History Narrative   Not on file   Social Determinants of Health   Financial Resource Strain: Not on file  Food Insecurity: Not on file  Transportation Needs: Not on file  Physical Activity: Not on file  Stress: Not on file  Social Connections: Not on file  Intimate Partner Violence: Not on file    FAMILY HISTORY: Family History  Problem Relation Age of Onset   Stroke Mother    Cancer Father    Heart attack Maternal Grandfather     ALLERGIES:  has No Known Allergies.  MEDICATIONS:  Current Outpatient Medications   Medication Sig Dispense Refill   cefadroxil (DURICEF) 500 MG capsule Take 500 mg by mouth 2 (two) times daily.     valACYclovir (VALTREX) 1000 MG tablet Take 1,000 mg by mouth 2 (two) times daily.     aspirin 81 MG tablet Take 81 mg by mouth daily.     dasatinib (SPRYCEL) 100 MG tablet Take 1 tablet (100 mg total) by mouth daily. 30 tablet 3   No current facility-administered medications for this visit.    REVIEW OF SYSTEMS:   Constitutional: ( - ) fevers, ( - )  chills , ( - ) night sweats Eyes: ( - ) blurriness of vision, ( - ) double vision, ( - ) watery eyes Ears, nose, mouth, throat, and face: ( - ) mucositis, ( - ) sore throat Respiratory: ( - ) cough, ( - ) dyspnea, ( - ) wheezes Cardiovascular: ( - ) palpitation, ( - ) chest discomfort, ( - ) lower extremity swelling Gastrointestinal:  ( - ) nausea, ( - ) heartburn, ( - ) change in bowel habits Skin: ( - ) abnormal skin rashes Lymphatics: ( - ) new lymphadenopathy, ( - ) easy bruising Neurological: ( - ) numbness, ( - ) tingling, ( - ) new weaknesses Behavioral/Psych: ( - ) mood change, ( - ) new changes  All other systems were reviewed with the patient and are negative.  PHYSICAL EXAMINATION: ECOG PERFORMANCE STATUS: 0 - Asymptomatic  Vitals:   08/26/22 0839  BP: (!) 128/93  Pulse: 92  Resp: 18  Temp: 98.3 F (36.8 C)  SpO2: 100%   Filed Weights   08/26/22 0839  Weight: 240 lb 8 oz (109.1 kg)    GENERAL: well appearing middle aged Caucasian male. Alert, no distress and comfortable SKIN: skin color, texture, turgor are normal, no rashes or significant lesions EYES: conjunctiva are pink and non-injected, sclera clear LUNGS: clear to auscultation and percussion with normal breathing effort HEART: regular rate & rhythm and no murmurs and no lower extremity edema Musculoskeletal: no cyanosis of digits and no clubbing. Large feet  PSYCH: alert & oriented x 3, fluent speech NEURO: no focal motor/sensory  deficits  LABORATORY DATA:  I have reviewed the data as listed    Latest Ref Rng & Units 08/26/2022    8:27 AM 05/27/2022   10:05 AM 02/25/2022    9:49 AM  CBC  WBC 4.0 - 10.5 K/uL 5.9  11.4  5.0   Hemoglobin 13.0 - 17.0 g/dL 14.7  13.9  14.4   Hematocrit 39.0 - 52.0 % 44.5  42.1  44.5   Platelets 150 - 400 K/uL 303  173  173        Latest Ref Rng & Units 08/26/2022    8:27 AM 05/27/2022   10:05 AM 02/25/2022    9:49 AM  CMP  Glucose 70 - 99 mg/dL 99  120  117   BUN 6 - 20 mg/dL _0 Creatinine 0.61 - 1.24 mg/dL 1.30  1.51  1.34   Sodium 135 - 145 mmol/L 138  137  140   Potassium 3.5 - 5.1 mmol/L 4.3  3.9  4.0   Chloride 98 - 111 mmol/L 106  104  105   CO2 22 - 32 mmol/L _1 Calcium 8.9 - 10.3 mg/dL 8.7  8.7  8.8   Total Protein 6.5 - 8.1 g/dL 6.9  7.0  7.2   Total Bilirubin 0.3 - 1.2 mg/dL 0.5  1.1  0.7   Alkaline Phos 38 - 126 U/L 59  55  61   AST 15 - 41 U/L _2 ALT 0 - 44 U/L _3 RADIOGRAPHIC STUDIES: No results found.  ASSESSMENT & PLAN Allen Adams 56 y.o. male with medical history significant for CML presents for a follow up visit.  After review of the labs, and discussion with the patient it is apparent that the patient is currently in a complete molecular response and has a white blood cell count that returned to normal.  His next BCR/ABL PCR was ordered today to assure the levels remain undetectable.    On exam today Allen Adams reports he is at his baseline level of health. Overall we will plan to continue dasatinib at the current dose and continue to monitor closely.   Current plan is for continued dasatinib 142m PO daily. His start date was 03/05/2020. This is the first line of therapy for this patient. Discontinuation of therapy can be considered in May 2024.    #CML, Chronic Phase --patient has had a complete molecular response, with undetectable/marked low at target BCR/ABL on PCR.  --patient is tolerating dasatnib 1024mPO  daily with no concerning symptoms at this time. Continue at the current dosage --will assess BCR/ABL PCR q 3 months to determine his molecular response.  --Labs today show white blood cell 5.9, hemoglobin 14.7, MCV 91.4, and platelet of 303. --after 3 years of treatment (May 2024) can discuss risks/benefits of d/c of treatment.  --RTC in 12 weeks   #Loose Stools, resolved --occasional, will continue to monitor  --he reports this occurs rarely at today's visit  #Leukocytosis-resolved -- White blood cell count today previously 1197.4ith neutrophilic predominance, normalized today.  --Findings are most consistent with infection given his recent sinus issues. --Continue to monitor  No orders of the defined types were placed in this encounter.  All questions were answered. The patient knows to call the clinic with any problems, questions or concerns.  A total of more than 30 minutes were spent on this encounter and over half of that time was spent on counseling and coordination of care as outlined above.   JoLedell PeoplesMD Department of Hematology/Oncology CoCandelariat WeSiskin Hospital For Physical Rehabilitationhone: [phone]: [phone]: [email or web address]  08/26/2022 1:52 PM

## 2022-08-27 ENCOUNTER — Other Ambulatory Visit: Payer: Self-pay | Admitting: *Deleted

## 2022-08-27 ENCOUNTER — Ambulatory Visit: Payer: BC Managed Care – PPO | Admitting: Hematology and Oncology

## 2022-08-27 ENCOUNTER — Other Ambulatory Visit: Payer: BC Managed Care – PPO

## 2022-08-27 DIAGNOSIS — C9210 Chronic myeloid leukemia, BCR/ABL-positive, not having achieved remission: Secondary | ICD-10-CM

## 2022-08-27 MED ORDER — DASATINIB 100 MG PO TABS: 100.0000 mg | ORAL_TABLET | Freq: Every day | ORAL | 3 refills | Status: DC

## 2022-08-31 LAB — BCR/ABL

## 2022-09-03 ENCOUNTER — Telehealth: Payer: Self-pay | Admitting: *Deleted

## 2022-09-03 NOTE — Telephone Encounter (Signed)
TCT patient regarding recent lab results. Spoke with him and advised that his BCR/ABL is undetectable. Advised we will see him as scheduled in 3 months. Allen Adams voiced understanding. He states he is feeling well.

## 2022-09-03 NOTE — Telephone Encounter (Signed)
-----   Message from Orson Slick, MD sent at 08/31/2022 10:48 AM EST ----- Please let Mr. Allen Adams know that his BCR/ABL remains undetectable! We will see him back in 3 months as scheduled.   ----- Message ----- From: Buel Ream, Lab [facility] [place] Sent: 08/26/2022   8:32 AM EST To: Orson Slick, MD

## 2022-09-17 DIAGNOSIS — G4733 Obstructive sleep apnea (adult) (pediatric): Secondary | ICD-10-CM | POA: Diagnosis not present

## 2022-09-22 ENCOUNTER — Other Ambulatory Visit: Payer: Self-pay | Admitting: *Deleted

## 2022-09-22 DIAGNOSIS — G4733 Obstructive sleep apnea (adult) (pediatric): Secondary | ICD-10-CM | POA: Diagnosis not present

## 2022-09-22 DIAGNOSIS — C9210 Chronic myeloid leukemia, BCR/ABL-positive, not having achieved remission: Secondary | ICD-10-CM

## 2022-09-22 MED ORDER — DASATINIB 100 MG PO TABS: 100.0000 mg | ORAL_TABLET | Freq: Every day | ORAL | 3 refills | Status: DC

## 2022-10-04 DIAGNOSIS — H40013 Open angle with borderline findings, low risk, bilateral: Secondary | ICD-10-CM | POA: Diagnosis not present

## 2022-10-17 DIAGNOSIS — G4733 Obstructive sleep apnea (adult) (pediatric): Secondary | ICD-10-CM | POA: Diagnosis not present

## 2022-11-17 DIAGNOSIS — G4733 Obstructive sleep apnea (adult) (pediatric): Secondary | ICD-10-CM | POA: Diagnosis not present

## 2022-11-29 ENCOUNTER — Other Ambulatory Visit: Payer: BC Managed Care – PPO

## 2022-11-29 ENCOUNTER — Ambulatory Visit: Payer: BC Managed Care – PPO | Admitting: Hematology and Oncology

## 2022-12-02 ENCOUNTER — Inpatient Hospital Stay (HOSPITAL_BASED_OUTPATIENT_CLINIC_OR_DEPARTMENT_OTHER): Payer: BC Managed Care – PPO | Admitting: Hematology and Oncology

## 2022-12-02 ENCOUNTER — Inpatient Hospital Stay: Payer: BC Managed Care – PPO | Attending: Hematology and Oncology

## 2022-12-02 VITALS — BP 160/93 | HR 93 | Temp 98.1°F | Resp 19 | Ht 76.0 in | Wt 246.1 lb

## 2022-12-02 DIAGNOSIS — Z8249 Family history of ischemic heart disease and other diseases of the circulatory system: Secondary | ICD-10-CM | POA: Diagnosis not present

## 2022-12-02 DIAGNOSIS — C9210 Chronic myeloid leukemia, BCR/ABL-positive, not having achieved remission: Secondary | ICD-10-CM

## 2022-12-02 DIAGNOSIS — Z79899 Other long term (current) drug therapy: Secondary | ICD-10-CM | POA: Diagnosis not present

## 2022-12-02 DIAGNOSIS — Z823 Family history of stroke: Secondary | ICD-10-CM | POA: Diagnosis not present

## 2022-12-02 DIAGNOSIS — Z809 Family history of malignant neoplasm, unspecified: Secondary | ICD-10-CM | POA: Insufficient documentation

## 2022-12-02 LAB — CMP (CANCER CENTER ONLY)
ALT: 17 U/L (ref 0–44)
AST: 16 U/L (ref 15–41)
Albumin: 4.3 g/dL (ref 3.5–5.0)
Alkaline Phosphatase: 63 U/L (ref 38–126)
Anion gap: 7 (ref 5–15)
BUN: 15 mg/dL (ref 6–20)
CO2: 25 mmol/L (ref 22–32)
Calcium: 9 mg/dL (ref 8.9–10.3)
Chloride: 107 mmol/L (ref 98–111)
Creatinine: 1.21 mg/dL (ref 0.61–1.24)
GFR, Estimated: >60 mL/min (ref >60)
Glucose, Bld: 95 mg/dL (ref 70–99)
Potassium: 4.1 mmol/L (ref 3.5–5.1)
Sodium: 139 mmol/L (ref 135–145)
Total Bilirubin: 0.5 mg/dL (ref 0.3–1.2)
Total Protein: 6.3 g/dL — ABNORMAL LOW (ref 6.5–8.1)

## 2022-12-02 LAB — CBC WITH DIFFERENTIAL (CANCER CENTER ONLY)
Abs Immature Granulocytes: 0.02 10*3/uL (ref 0.00–0.07)
Basophils Absolute: 0.1 10*3/uL (ref 0.0–0.1)
Basophils Relative: 1 %
Eosinophils Absolute: 0.1 10*3/uL (ref 0.0–0.5)
Eosinophils Relative: 2 %
HCT: 44.9 % (ref 39.0–52.0)
Hemoglobin: 14.9 g/dL (ref 13.0–17.0)
Immature Granulocytes: 0 %
Lymphocytes Relative: 40 %
Lymphs Abs: 2 10*3/uL (ref 0.7–4.0)
MCH: 30.2 pg (ref 26.0–34.0)
MCHC: 33.2 g/dL (ref 30.0–36.0)
MCV: 90.9 fL (ref 80.0–100.0)
Monocytes Absolute: 0.5 10*3/uL (ref 0.1–1.0)
Monocytes Relative: 10 %
Neutro Abs: 2.4 10*3/uL (ref 1.7–7.7)
Neutrophils Relative %: 47 %
Platelet Count: 219 10*3/uL (ref 150–400)
RBC: 4.94 MIL/uL (ref 4.22–5.81)
RDW: 14.2 % (ref 11.5–15.5)
WBC Count: 5.1 10*3/uL (ref 4.0–10.5)
nRBC: 0 % (ref 0.0–0.2)

## 2022-12-02 MED ORDER — DASATINIB 100 MG PO TABS: 100.0000 mg | ORAL_TABLET | Freq: Every day | ORAL | 3 refills | Status: DC

## 2022-12-02 NOTE — Progress Notes (Signed)
[facility] [phone]   [phone]  PROGRESS NOTE  Patient Care Team: Harlan Stains, MD as PCP - General (Family Medicine)  Hematological/Oncological History # Chronic Myeloid Leukemia, Chronic Phase 1) 06/04/2016: WBC count 15.5, ANC 12.9 2) 01/17/2020: WBC 18.9, Hgb 16.2, Plt 537, MCV 86.7 3) 02/01/2020: establish care with Dr. Lindi Adie. Flow cytometry showed no abnormalities. JAK2 negative. BCR/ABL PCR showed 90.67% positive nuclei for the fusion. Diagnosed with CML 4) 02/21/2020: establish care with Dr. Lorenso Courier 5) 03/05/2020: Bone marrow biopsy performed, findings consistent with CML. Started Dasatinib '100mg'$  PO daily.  6) 04/03/2020: complete hematological response with normalization of blood counts.  7) 05/30/2020: BCR/ABL PCR shows undetectable levels 8) 08/29/2020: BCR/ABL PCR shows undetectable levels 9) 11/27/2020: BCR/ABL PCR shows 0.0059% 10) 02/26/2021: BCR/ABL PCR shows detected below 0.002% 11) 05/28/2021: BCR/ABL PCR shows 0.0094% 12) 02/25/2022: BCR/ABL PCR shows undetectable levels 13) 05/27/2022: BCR/ABL PCR shows undetectable levels 14) 08/26/2022: BCR/ABL PCR shows undetectable levels  Interval History:  Allen Adams 57 y.o. male with medical history significant for CML presents for a follow up visit. The patient's last visit was on 09/03/22 at which time he was found to have a continued complete molecular response to treatment. In the interim since the last visit he has been well with no major changes in his health.   On exam today Allen Adams reports he has been well overall in the interim since her last visit.  He reports he is had no infections and his energy levels are quite good.  He is tolerating Dasatinib without any side effects.  He denies any nausea, vomiting, or diarrhea.  He reports that he has been thinking hard about discontinuation of the Dasatinib at the 3-year mark.  He reports he would like to continue the medication and will  consider discontinuation at a future point.  He notes he is happy with how stable things are and would like to continue on his current path.  He reports having good levels of energy and denies any fevers, chills, sweats.  A full 10 point ROS is listed below.  He is willing and able to continue this medication.   MEDICAL HISTORY:  Past Medical History:  Diagnosis Date   Anxiety    Atrial fibrillation (HCC)    Hypertriglyceridemia    OSA (obstructive sleep apnea)    (SPLIT 03/05/11, ESS 6, AHI 41/hr REM 90/hr, RDI 83/hr REM 120/hr, O2 nadir 83%; CPAP 9 with AHI 0/hr), Dr Maxwell Caul    SURGICAL HISTORY: Past Surgical History:  Procedure Laterality Date   Lasik like eye surgery, PPK     lumbar back surgery      SOCIAL HISTORY: Social History   Socioeconomic History   Marital status: Married    Spouse name: Not on file   Number of children: Not on file   Years of education: Not on file   Highest education level: Not on file  Occupational History   Not on file  Tobacco Use   Smoking status: Never   Smokeless tobacco: Never  Substance and Sexual Activity   Alcohol use: Not on file   Drug use: Not on file   Sexual activity: Not on file  Other Topics Concern   Not on file  Social History Narrative   Not on file   Social Determinants of Health   Financial Resource Strain: Not on file  Food Insecurity: Not on file  Transportation Needs: Not on file  Physical Activity: Not on file  Stress: Not on file  Social Connections: Not on file  Intimate Partner Violence: Not on file    FAMILY HISTORY: Family History  Problem Relation Age of Onset   Stroke Mother    Cancer Father    Heart attack Maternal Grandfather     ALLERGIES:  has No Known Allergies.  MEDICATIONS:  Current Outpatient Medications  Medication Sig Dispense Refill   aspirin 81 MG tablet Take 81 mg by mouth daily.     dasatinib (SPRYCEL) 100 MG tablet Take 1 tablet (100 mg total) by mouth daily. 30 tablet 3    valACYclovir (VALTREX) 1000 MG tablet Take 1,000 mg by mouth 2 (two) times daily.     No current facility-administered medications for this visit.    REVIEW OF SYSTEMS:   Constitutional: ( - ) fevers, ( - )  chills , ( - ) night sweats Eyes: ( - ) blurriness of vision, ( - ) double vision, ( - ) watery eyes Ears, nose, mouth, throat, and face: ( - ) mucositis, ( - ) sore throat Respiratory: ( - ) cough, ( - ) dyspnea, ( - ) wheezes Cardiovascular: ( - ) palpitation, ( - ) chest discomfort, ( - ) lower extremity swelling Gastrointestinal:  ( - ) nausea, ( - ) heartburn, ( - ) change in bowel habits Skin: ( - ) abnormal skin rashes Lymphatics: ( - ) new lymphadenopathy, ( - ) easy bruising Neurological: ( - ) numbness, ( - ) tingling, ( - ) new weaknesses Behavioral/Psych: ( - ) mood change, ( - ) new changes  All other systems were reviewed with the patient and are negative.  PHYSICAL EXAMINATION: ECOG PERFORMANCE STATUS: 0 - Asymptomatic  Vitals:   12/02/22 0820  BP: (!) 160/93  Pulse: 93  Resp: 19  Temp: 98.1 F (36.7 C)  SpO2: 99%   Filed Weights   12/02/22 0820  Weight: 246 lb 1.6 oz (111.6 kg)    GENERAL: well appearing middle aged Caucasian male. Alert, no distress and comfortable SKIN: skin color, texture, turgor are normal, no rashes or significant lesions EYES: conjunctiva are pink and non-injected, sclera clear LUNGS: clear to auscultation and percussion with normal breathing effort HEART: regular rate & rhythm and no murmurs and no lower extremity edema Musculoskeletal: no cyanosis of digits and no clubbing. Large feet  PSYCH: alert & oriented x 3, fluent speech NEURO: no focal motor/sensory deficits  LABORATORY DATA:  I have reviewed the data as listed    Latest Ref Rng & Units 12/02/2022    8:07 AM 08/26/2022    8:27 AM 05/27/2022   10:05 AM  CBC  WBC 4.0 - 10.5 K/uL 5.1  5.9  11.4   Hemoglobin 13.0 - 17.0 g/dL 14.9  14.7  13.9   Hematocrit 39.0 - 52.0  % 44.9  44.5  42.1   Platelets 150 - 400 K/uL 219  303  173        Latest Ref Rng & Units 08/26/2022    8:27 AM 05/27/2022   10:05 AM 02/25/2022    9:49 AM  CMP  Glucose 70 - 99 mg/dL 99  120  117   BUN 6 - 20 mg/dL '16  26  20   '$ Creatinine 0.61 - 1.24 mg/dL 1.30  1.51  1.34   Sodium 135 - 145 mmol/L 138  137  140   Potassium 3.5 - 5.1 mmol/L 4.3  3.9  4.0   Chloride 98 - 111 mmol/L  106  104  105   CO2 22 - 32 mmol/L '25  24  25   '$ Calcium 8.9 - 10.3 mg/dL 8.7  8.7  8.8   Total Protein 6.5 - 8.1 g/dL 6.9  7.0  7.2   Total Bilirubin 0.3 - 1.2 mg/dL 0.5  1.1  0.7   Alkaline Phos 38 - 126 U/L 59  55  61   AST 15 - 41 U/L '20  26  16   '$ ALT 0 - 44 U/L '31  28  15     '$ RADIOGRAPHIC STUDIES: No results found.  ASSESSMENT & PLAN Allen Adams 57 y.o. male with medical history significant for CML presents for a follow up visit.  After review of the labs, and discussion with the patient it is apparent that the patient is currently in a complete molecular response and has a white blood cell count that returned to normal.  His next BCR/ABL PCR was ordered today to assure the levels remain undetectable.    On exam today Allen Adams reports he is at his baseline level of health. Overall we will plan to continue dasatinib at the current dose and continue to monitor closely.   Current plan is for continued dasatinib '100mg'$  PO daily. His start date was 03/05/2020. This is the first line of therapy for this patient. Discontinuation of therapy can be considered in May 2024.  We discussed discontinuation on 12/02/2022 and the patient notes he would like to continue therapy and does not wish to change his current treatment trajectory.  NCCN Recommendations for TKI Discontinuation: Therapy x 3 years with greater than 2 years of stable molecular response. Recommend testing q 1 month  for 6 months followed by every other month for an additional 6 months (total 1 year). Recommend testing quarterly thereafter.  Allen Adams  qualifies for d/c of Sprycel at his next visit.   Datastinib Free Remission in 44% of patients in one study. No relapses after month 39. Restarted dasatnib resulted in MMR within 1.9 months in patients with relapse. (J Haematol. 2023 Sep;202(5):942-952)  #CML, Chronic Phase --patient has had a complete molecular response, with undetectable/marked low at target BCR/ABL on PCR.  --patient is tolerating dasatnib '100mg'$  PO daily with no concerning symptoms at this time. Continue at the current dosage --will assess BCR/ABL PCR q 3 months to determine his molecular response.  --Labs today show white blood cell 5.1, hemoglobin 14.9, MCV 90.9, and platelets of 219. --after 3 years of treatment (May 2024) there is the option to d/c of treatment.   --We discussed this today and he reported that he would like to continue on his Dasatinib therapy and does not wish to discontinue and monitor.  Per his wishes we will continue Dasatinib therapy.  This is a reasonable choice as approximately 50% of patients relapse after discontinuation of the medication and require restarting and indefinite treatment anyway. --Labs today show white blood cell count 5.1, hemoglobin 14.9, MCV 90.9, and platelets of 219 with normal liver/kidney function.  --RTC in 12 weeks for labs  #Loose Stools, resolved --occasional, will continue to monitor  --he reports this occurs rarely at today's visit  #Leukocytosis-resolved -- White blood cell count today 5.1 with neutrophilic predominance, normalized today.  --Findings are most consistent with infection given his recent sinus issues. --Continue to monitor  No orders of the defined types were placed in this encounter.  All questions were answered. The patient knows to call the clinic with any problems,  questions or concerns.  A total of more than 30 minutes were spent on this encounter and over half of that time was spent on counseling and coordination of care as outlined above.    Allen Peoples, MD Department of Hematology/Oncology [place] [facility] Phone: [phone] Pager: [phone] Email: [email or web address]  12/02/2022 8:43 AM

## 2022-12-07 ENCOUNTER — Telehealth: Payer: Self-pay | Admitting: *Deleted

## 2022-12-07 LAB — BCR/ABL

## 2022-12-07 NOTE — Telephone Encounter (Signed)
-----   Message from Orson Slick, MD sent at 12/07/2022  1:25 PM EST ----- Please let Allen Adams know that his BCR/ABL levels remain undetectable. He is doing great. We will plan to see him back in 3 months time as scheduled.  ----- Message ----- From: Buel Ream, Lab In [place] Sent: 12/02/2022   8:18 AM EST To: Orson Slick, MD

## 2022-12-09 ENCOUNTER — Telehealth: Payer: Self-pay | Admitting: Hematology and Oncology

## 2022-12-09 NOTE — Telephone Encounter (Signed)
Called patient per provider PAL to reschedule May appointment. Left voicemail with new appointment information and contact details if needing to reschedule.

## 2022-12-16 DIAGNOSIS — G4733 Obstructive sleep apnea (adult) (pediatric): Secondary | ICD-10-CM | POA: Diagnosis not present

## 2022-12-20 DIAGNOSIS — Z0000 Encounter for general adult medical examination without abnormal findings: Secondary | ICD-10-CM | POA: Diagnosis not present

## 2022-12-20 DIAGNOSIS — Z125 Encounter for screening for malignant neoplasm of prostate: Secondary | ICD-10-CM | POA: Diagnosis not present

## 2022-12-20 DIAGNOSIS — E785 Hyperlipidemia, unspecified: Secondary | ICD-10-CM | POA: Diagnosis not present

## 2023-01-14 DIAGNOSIS — G4733 Obstructive sleep apnea (adult) (pediatric): Secondary | ICD-10-CM | POA: Diagnosis not present

## 2023-02-14 ENCOUNTER — Other Ambulatory Visit (HOSPITAL_COMMUNITY): Payer: Self-pay

## 2023-02-14 DIAGNOSIS — G4733 Obstructive sleep apnea (adult) (pediatric): Secondary | ICD-10-CM | POA: Diagnosis not present

## 2023-02-28 ENCOUNTER — Other Ambulatory Visit: Payer: BC Managed Care – PPO

## 2023-02-28 ENCOUNTER — Ambulatory Visit: Payer: BC Managed Care – PPO | Admitting: Hematology and Oncology

## 2023-03-03 ENCOUNTER — Ambulatory Visit: Payer: BC Managed Care – PPO | Admitting: Hematology and Oncology

## 2023-03-03 ENCOUNTER — Other Ambulatory Visit: Payer: BC Managed Care – PPO

## 2023-03-10 ENCOUNTER — Inpatient Hospital Stay (HOSPITAL_BASED_OUTPATIENT_CLINIC_OR_DEPARTMENT_OTHER): Payer: BC Managed Care – PPO | Admitting: Hematology and Oncology

## 2023-03-10 ENCOUNTER — Inpatient Hospital Stay: Payer: BC Managed Care – PPO | Attending: Hematology and Oncology

## 2023-03-10 ENCOUNTER — Other Ambulatory Visit: Payer: Self-pay | Admitting: *Deleted

## 2023-03-10 VITALS — BP 143/85 | HR 90 | Temp 97.3°F | Resp 17 | Wt 240.8 lb

## 2023-03-10 DIAGNOSIS — C9210 Chronic myeloid leukemia, BCR/ABL-positive, not having achieved remission: Secondary | ICD-10-CM

## 2023-03-10 DIAGNOSIS — Z823 Family history of stroke: Secondary | ICD-10-CM | POA: Diagnosis not present

## 2023-03-10 DIAGNOSIS — Z8249 Family history of ischemic heart disease and other diseases of the circulatory system: Secondary | ICD-10-CM | POA: Diagnosis not present

## 2023-03-10 DIAGNOSIS — Z79899 Other long term (current) drug therapy: Secondary | ICD-10-CM | POA: Diagnosis not present

## 2023-03-10 DIAGNOSIS — E781 Pure hyperglyceridemia: Secondary | ICD-10-CM | POA: Insufficient documentation

## 2023-03-10 DIAGNOSIS — Z809 Family history of malignant neoplasm, unspecified: Secondary | ICD-10-CM | POA: Insufficient documentation

## 2023-03-10 DIAGNOSIS — R059 Cough, unspecified: Secondary | ICD-10-CM | POA: Diagnosis not present

## 2023-03-10 DIAGNOSIS — K30 Functional dyspepsia: Secondary | ICD-10-CM | POA: Diagnosis not present

## 2023-03-10 DIAGNOSIS — R0602 Shortness of breath: Secondary | ICD-10-CM | POA: Diagnosis not present

## 2023-03-10 DIAGNOSIS — H9209 Otalgia, unspecified ear: Secondary | ICD-10-CM | POA: Insufficient documentation

## 2023-03-10 LAB — CMP (CANCER CENTER ONLY)
ALT: 15 U/L (ref 0–44)
AST: 16 U/L (ref 15–41)
Albumin: 4.6 g/dL (ref 3.5–5.0)
Alkaline Phosphatase: 56 U/L (ref 38–126)
Anion gap: 7 (ref 5–15)
BUN: 17 mg/dL (ref 6–20)
CO2: 26 mmol/L (ref 22–32)
Calcium: 9.1 mg/dL (ref 8.9–10.3)
Chloride: 107 mmol/L (ref 98–111)
Creatinine: 1.4 mg/dL — ABNORMAL HIGH (ref 0.61–1.24)
GFR, Estimated: 59 mL/min — ABNORMAL LOW (ref >60)
Glucose, Bld: 111 mg/dL — ABNORMAL HIGH (ref 70–99)
Potassium: 4.4 mmol/L (ref 3.5–5.1)
Sodium: 140 mmol/L (ref 135–145)
Total Bilirubin: 0.5 mg/dL (ref 0.3–1.2)
Total Protein: 7 g/dL (ref 6.5–8.1)

## 2023-03-10 LAB — CBC WITH DIFFERENTIAL (CANCER CENTER ONLY)
Abs Immature Granulocytes: 0.02 10*3/uL (ref 0.00–0.07)
Basophils Absolute: 0.1 10*3/uL (ref 0.0–0.1)
Basophils Relative: 1 %
Eosinophils Absolute: 0.1 10*3/uL (ref 0.0–0.5)
Eosinophils Relative: 2 %
HCT: 47.7 % (ref 39.0–52.0)
Hemoglobin: 15.5 g/dL (ref 13.0–17.0)
Immature Granulocytes: 0 %
Lymphocytes Relative: 40 %
Lymphs Abs: 2.2 10*3/uL (ref 0.7–4.0)
MCH: 29.4 pg (ref 26.0–34.0)
MCHC: 32.5 g/dL (ref 30.0–36.0)
MCV: 90.5 fL (ref 80.0–100.0)
Monocytes Absolute: 0.5 10*3/uL (ref 0.1–1.0)
Monocytes Relative: 9 %
Neutro Abs: 2.6 10*3/uL (ref 1.7–7.7)
Neutrophils Relative %: 48 %
Platelet Count: 238 10*3/uL (ref 150–400)
RBC: 5.27 MIL/uL (ref 4.22–5.81)
RDW: 15 % (ref 11.5–15.5)
WBC Count: 5.5 10*3/uL (ref 4.0–10.5)
nRBC: 0 % (ref 0.0–0.2)

## 2023-03-10 MED ORDER — DASATINIB 100 MG PO TABS: 100.0000 mg | ORAL_TABLET | Freq: Every day | ORAL | 3 refills | Status: DC

## 2023-03-10 NOTE — Progress Notes (Signed)
[facility] Telephone:([phone]   Fax:(336) [phone]  PROGRESS NOTE  Patient Care Team: Laurann Montana, MD as PCP - General (Family Medicine)  Hematological/Oncological History # Chronic Myeloid Leukemia, Chronic Phase 1) 06/04/2016: WBC count 15.5, ANC 12.9 2) 01/17/2020: WBC 18.9, Hgb 16.2, Plt 537, MCV 86.7 3) 02/01/2020: establish care with Dr. Pamelia Hoit. Flow cytometry showed no abnormalities. JAK2 negative. BCR/ABL PCR showed 90.67% positive nuclei for the fusion. Diagnosed with CML 4) 02/21/2020: establish care with Dr. Leonides Schanz 5) 03/05/2020: Bone marrow biopsy performed, findings consistent with CML. Started Dasatinib 100mg  PO daily.  6) 04/03/2020: complete hematological response with normalization of blood counts.  7) 05/30/2020: BCR/ABL PCR shows undetectable levels 8) 08/29/2020: BCR/ABL PCR shows undetectable levels 9) 11/27/2020: BCR/ABL PCR shows 0.0059% 10) 02/26/2021: BCR/ABL PCR shows detected below 0.002% 11) 05/28/2021: BCR/ABL PCR shows 0.0094% 12) 02/25/2022: BCR/ABL PCR shows undetectable levels 13) 05/27/2022: BCR/ABL PCR shows undetectable levels 14) 08/26/2022: BCR/ABL PCR shows undetectable levels 15) 12/02/2022: BCR/ABL PCR shows undetectable levels  Interval History:  Allen Adams 57 y.o. male with medical history significant for CML presents for a follow up visit. The patient's last visit was on 12/02/21 at which time he was found to have a continued complete molecular response to treatment. In the interim since the last visit he has been well with no major changes in his health.   On exam today Allen Adams reports he has had no major changes in his health in the interim since her last visit.  He recently developed an earache which is steadily improving.  He reports that sometimes he feels a "pop" and he is able to hear better.  He reports he is not having a runny nose, sore throat, or fevers, chills, sweats.  He notes that he has a good appetite and his energy  levels are strong.  He notes he is not having side effects from medication such as cough, shortness of breath, or stomach upset.  Overall he is willing and able to proceed with Dasatinib therapy at this time.  He reports having good levels of energy and denies any fevers, chills, sweats.  A full 10 point ROS is listed below.    MEDICAL HISTORY:  Past Medical History:  Diagnosis Date   Anxiety    Atrial fibrillation (HCC)    Hypertriglyceridemia    OSA (obstructive sleep apnea)    (SPLIT 03/05/11, ESS 6, AHI 41/hr REM 90/hr, RDI 83/hr REM 120/hr, O2 nadir 83%; CPAP 9 with AHI 0/hr), Dr Earl Gala    SURGICAL HISTORY: Past Surgical History:  Procedure Laterality Date   Lasik like eye surgery, PPK     lumbar back surgery      SOCIAL HISTORY: Social History   Socioeconomic History   Marital status: Married    Spouse name: Not on file   Number of children: Not on file   Years of education: Not on file   Highest education level: Not on file  Occupational History   Not on file  Tobacco Use   Smoking status: Never   Smokeless tobacco: Never  Substance and Sexual Activity   Alcohol use: Not on file   Drug use: Not on file   Sexual activity: Not on file  Other Topics Concern   Not on file  Social History Narrative   Not on file   Social Determinants of Health   Financial Resource Strain: Not on file  Food Insecurity: Not on file  Transportation Needs: Not on file  Physical Activity: Not on file  Stress: Not on file  Social Connections: Not on file  Intimate Partner Violence: Not on file    FAMILY HISTORY: Family History  Problem Relation Age of Onset   Stroke Mother    Cancer Father    Heart attack Maternal Grandfather     ALLERGIES:  has No Known Allergies.  MEDICATIONS:  Current Outpatient Medications  Medication Sig Dispense Refill   aspirin 81 MG tablet Take 81 mg by mouth daily.     dasatinib (SPRYCEL) 100 MG tablet Take 1 tablet (100 mg total) by mouth  daily. 30 tablet 3   No current facility-administered medications for this visit.    REVIEW OF SYSTEMS:   Constitutional: ( - ) fevers, ( - )  chills , ( - ) night sweats Eyes: ( - ) blurriness of vision, ( - ) double vision, ( - ) watery eyes Ears, nose, mouth, throat, and face: ( - ) mucositis, ( - ) sore throat Respiratory: ( - ) cough, ( - ) dyspnea, ( - ) wheezes Cardiovascular: ( - ) palpitation, ( - ) chest discomfort, ( - ) lower extremity swelling Gastrointestinal:  ( - ) nausea, ( - ) heartburn, ( - ) change in bowel habits Skin: ( - ) abnormal skin rashes Lymphatics: ( - ) new lymphadenopathy, ( - ) easy bruising Neurological: ( - ) numbness, ( - ) tingling, ( - ) new weaknesses Behavioral/Psych: ( - ) mood change, ( - ) new changes  All other systems were reviewed with the patient and are negative.  PHYSICAL EXAMINATION: ECOG PERFORMANCE STATUS: 0 - Asymptomatic  Vitals:   03/10/23 0902  BP: (!) 143/85  Pulse: 90  Resp: 17  Temp: (!) 97.3 F (36.3 C)  SpO2: 99%    Filed Weights   03/10/23 0902  Weight: 240 lb 12.8 oz (109.2 kg)     GENERAL: well appearing middle aged Caucasian male. Alert, no distress and comfortable SKIN: skin color, texture, turgor are normal, no rashes or significant lesions EYES: conjunctiva are pink and non-injected, sclera clear LUNGS: clear to auscultation and percussion with normal breathing effort HEART: regular rate & rhythm and no murmurs and no lower extremity edema Musculoskeletal: no cyanosis of digits and no clubbing. Large feet  PSYCH: alert & oriented x 3, fluent speech NEURO: no focal motor/sensory deficits  LABORATORY DATA:  I have reviewed the data as listed    Latest Ref Rng & Units 03/10/2023    8:41 AM 12/02/2022    8:07 AM 08/26/2022    8:27 AM  CBC  WBC 4.0 - 10.5 K/uL 5.5  5.1  5.9   Hemoglobin 13.0 - 17.0 g/dL 16.1  09.6  04.5   Hematocrit 39.0 - 52.0 % 47.7  44.9  44.5   Platelets 150 - 400 K/uL 238  219   303        Latest Ref Rng & Units 03/10/2023    8:41 AM 12/02/2022    8:07 AM 08/26/2022    8:27 AM  CMP  Glucose 70 - 99 mg/dL 409  95  99   BUN 6 - 20 mg/dL 17  15  16    Creatinine 0.61 - 1.24 mg/dL 8.11  9.14  7.82   Sodium 135 - 145 mmol/L 140  139  138   Potassium 3.5 - 5.1 mmol/L 4.4  4.1  4.3   Chloride 98 - 111 mmol/L 107  107  106  CO2 22 - 32 mmol/L 26  25  25    Calcium 8.9 - 10.3 mg/dL 9.1  9.0  8.7   Total Protein 6.5 - 8.1 g/dL 7.0  6.3  6.9   Total Bilirubin 0.3 - 1.2 mg/dL 0.5  0.5  0.5   Alkaline Phos 38 - 126 U/L 56  63  59   AST 15 - 41 U/L 16  16  20    ALT 0 - 44 U/L 15  17  31      RADIOGRAPHIC STUDIES: No results found.  ASSESSMENT & PLAN Allen Adams 57 y.o. male with medical history significant for CML presents for a follow up visit.  After review of the labs, and discussion with the patient it is apparent that the patient is currently in a complete molecular response and has a white blood cell count that returned to normal.  His next BCR/ABL PCR was ordered today to assure the levels remain undetectable.    On exam today Allen Adams reports he is at his baseline level of health. Overall we will plan to continue dasatinib at the current dose and continue to monitor closely.   Current plan is for continued dasatinib 100mg  PO daily. His start date was 03/05/2020. This is the first line of therapy for this patient. Discontinuation of therapy can be considered in May 2024.  We discussed discontinuation on 12/02/2022 and the patient notes he would like to continue therapy and does not wish to change his current treatment trajectory.  NCCN Recommendations for TKI Discontinuation: Therapy x 3 years with greater than 2 years of stable molecular response. Recommend testing q 1 month  for 6 months followed by every other month for an additional 6 months (total 1 year). Recommend testing quarterly thereafter.  Mr. Vanname qualifies for d/c of Sprycel at his next visit.    Datastinib Free Remission in 44% of patients in one study. No relapses after month 39. Restarted dasatnib resulted in MMR within 1.9 months in patients with relapse. (J Haematol. 2023 Sep;202(5):942-952)  #CML, Chronic Phase --patient has had a complete molecular response, with undetectable/marked low at target BCR/ABL on PCR.  --patient is tolerating dasatnib 100mg  PO daily with no concerning symptoms at this time. Continue at the current dosage --will assess BCR/ABL PCR q 3 months to determine his molecular response.  --Labs today show white blood cell 5.1, hemoglobin 14.9, MCV 90.9, and platelets of 219. --after 3 years of treatment (May 2024) there is the option to d/c of treatment.   --We discussed this today and he reported that he would like to continue on his Dasatinib therapy and does not wish to discontinue and monitor.  Per his wishes we will continue Dasatinib therapy.  This is a reasonable choice as approximately 50% of patients relapse after discontinuation of the medication and require restarting and indefinite treatment anyway. --Labs today show white blood cell count 5.5, hemoglobin 15.5, MCV 90.5, and platelets of 238 --RTC in 12 weeks for labs   No orders of the defined types were placed in this encounter.  All questions were answered. The patient knows to call the clinic with any problems, questions or concerns.  A total of more than 30 minutes were spent on this encounter and over half of that time was spent on counseling and coordination of care as outlined above.   Ulysees Barns, MD Department of Hematology/Oncology [facility] at [facility] Phone: [phone] Pager: [phone] Email: [email or web address]  03/10/2023  9:42 AM

## 2023-03-15 LAB — BCR/ABL

## 2023-03-16 ENCOUNTER — Telehealth: Payer: Self-pay | Admitting: *Deleted

## 2023-03-16 DIAGNOSIS — G4733 Obstructive sleep apnea (adult) (pediatric): Secondary | ICD-10-CM | POA: Diagnosis not present

## 2023-03-16 NOTE — Telephone Encounter (Signed)
TCT patient regarding recent lab results. No answer but was able to leave vm message on his identified vm. Advised that his CML levels remain undetectable. This is an excellent result. Will plan to see him back in 3 months as scheduled. Advised to call back with any questions or concerns to [phone].

## 2023-03-16 NOTE — Telephone Encounter (Signed)
-----   Message from Jaci Standard, MD sent at 03/16/2023  9:24 AM EDT ----- Please let Allen Adams know that his CML levels remain undetectable.  This is an excellent result.  Will plan to see him back in 3 months as scheduled. ----- Message ----- From: Leory Plowman, Lab In [place] Sent: 03/10/2023   8:49 AM EDT To: Jaci Standard, MD

## 2023-04-01 ENCOUNTER — Other Ambulatory Visit: Payer: Self-pay | Admitting: *Deleted

## 2023-04-01 DIAGNOSIS — C9210 Chronic myeloid leukemia, BCR/ABL-positive, not having achieved remission: Secondary | ICD-10-CM

## 2023-04-01 MED ORDER — DASATINIB 100 MG PO TABS: 100.0000 mg | ORAL_TABLET | Freq: Every day | ORAL | 2 refills | Status: DC

## 2023-04-12 DIAGNOSIS — H40013 Open angle with borderline findings, low risk, bilateral: Secondary | ICD-10-CM | POA: Diagnosis not present

## 2023-04-13 DIAGNOSIS — R590 Localized enlarged lymph nodes: Secondary | ICD-10-CM | POA: Diagnosis not present

## 2023-04-13 DIAGNOSIS — M79605 Pain in left leg: Secondary | ICD-10-CM | POA: Diagnosis not present

## 2023-04-13 DIAGNOSIS — M7989 Other specified soft tissue disorders: Secondary | ICD-10-CM | POA: Diagnosis not present

## 2023-04-15 DIAGNOSIS — G4733 Obstructive sleep apnea (adult) (pediatric): Secondary | ICD-10-CM | POA: Diagnosis not present

## 2023-04-16 DIAGNOSIS — G4733 Obstructive sleep apnea (adult) (pediatric): Secondary | ICD-10-CM | POA: Diagnosis not present

## 2023-04-25 DIAGNOSIS — G4733 Obstructive sleep apnea (adult) (pediatric): Secondary | ICD-10-CM | POA: Diagnosis not present

## 2023-04-25 DIAGNOSIS — I480 Paroxysmal atrial fibrillation: Secondary | ICD-10-CM | POA: Diagnosis not present

## 2023-05-16 DIAGNOSIS — G4733 Obstructive sleep apnea (adult) (pediatric): Secondary | ICD-10-CM | POA: Diagnosis not present

## 2023-06-08 ENCOUNTER — Other Ambulatory Visit: Payer: Self-pay | Admitting: Physician Assistant

## 2023-06-08 DIAGNOSIS — C9210 Chronic myeloid leukemia, BCR/ABL-positive, not having achieved remission: Secondary | ICD-10-CM

## 2023-06-09 ENCOUNTER — Inpatient Hospital Stay: Payer: BC Managed Care – PPO

## 2023-06-09 ENCOUNTER — Inpatient Hospital Stay: Payer: BC Managed Care – PPO | Admitting: Hematology and Oncology

## 2023-06-09 ENCOUNTER — Inpatient Hospital Stay: Payer: BC Managed Care – PPO | Attending: Hematology and Oncology | Admitting: Physician Assistant

## 2023-06-09 ENCOUNTER — Other Ambulatory Visit: Payer: Self-pay

## 2023-06-09 DIAGNOSIS — Z79899 Other long term (current) drug therapy: Secondary | ICD-10-CM | POA: Diagnosis not present

## 2023-06-09 DIAGNOSIS — Z8249 Family history of ischemic heart disease and other diseases of the circulatory system: Secondary | ICD-10-CM | POA: Insufficient documentation

## 2023-06-09 DIAGNOSIS — C9210 Chronic myeloid leukemia, BCR/ABL-positive, not having achieved remission: Secondary | ICD-10-CM | POA: Insufficient documentation

## 2023-06-09 DIAGNOSIS — Z809 Family history of malignant neoplasm, unspecified: Secondary | ICD-10-CM | POA: Diagnosis not present

## 2023-06-09 DIAGNOSIS — Z823 Family history of stroke: Secondary | ICD-10-CM | POA: Insufficient documentation

## 2023-06-09 LAB — CMP (CANCER CENTER ONLY)
ALT: 16 U/L (ref 0–44)
AST: 17 U/L (ref 15–41)
Albumin: 4.5 g/dL (ref 3.5–5.0)
Alkaline Phosphatase: 63 U/L (ref 38–126)
Anion gap: 9 (ref 5–15)
BUN: 17 mg/dL (ref 6–20)
CO2: 24 mmol/L (ref 22–32)
Calcium: 8.7 mg/dL — ABNORMAL LOW (ref 8.9–10.3)
Chloride: 107 mmol/L (ref 98–111)
Creatinine: 1.29 mg/dL — ABNORMAL HIGH (ref 0.61–1.24)
GFR, Estimated: >60 mL/min (ref >60)
Glucose, Bld: 116 mg/dL — ABNORMAL HIGH (ref 70–99)
Potassium: 4.7 mmol/L (ref 3.5–5.1)
Sodium: 140 mmol/L (ref 135–145)
Total Bilirubin: 0.5 mg/dL (ref 0.3–1.2)
Total Protein: 6.9 g/dL (ref 6.5–8.1)

## 2023-06-09 LAB — CBC WITH DIFFERENTIAL (CANCER CENTER ONLY)
Abs Immature Granulocytes: 0.01 10*3/uL (ref 0.00–0.07)
Basophils Absolute: 0.1 10*3/uL (ref 0.0–0.1)
Basophils Relative: 1 %
Eosinophils Absolute: 0.1 10*3/uL (ref 0.0–0.5)
Eosinophils Relative: 2 %
HCT: 45.6 % (ref 39.0–52.0)
Hemoglobin: 14.7 g/dL (ref 13.0–17.0)
Immature Granulocytes: 0 %
Lymphocytes Relative: 42 %
Lymphs Abs: 1.7 10*3/uL (ref 0.7–4.0)
MCH: 30.1 pg (ref 26.0–34.0)
MCHC: 32.2 g/dL (ref 30.0–36.0)
MCV: 93.4 fL (ref 80.0–100.0)
Monocytes Absolute: 0.4 10*3/uL (ref 0.1–1.0)
Monocytes Relative: 9 %
Neutro Abs: 1.8 10*3/uL (ref 1.7–7.7)
Neutrophils Relative %: 46 %
Platelet Count: 183 10*3/uL (ref 150–400)
RBC: 4.88 MIL/uL (ref 4.22–5.81)
RDW: 15.2 % (ref 11.5–15.5)
WBC Count: 4 10*3/uL (ref 4.0–10.5)
nRBC: 0 % (ref 0.0–0.2)

## 2023-06-09 MED ORDER — DASATINIB 100 MG PO TABS
100.0000 mg | ORAL_TABLET | Freq: Every day | ORAL | 2 refills | Status: DC
Start: 2023-06-09 — End: 2023-09-08

## 2023-06-09 NOTE — Progress Notes (Signed)
[facility] Telephone:([phone]) [phone]   [phone]  PROGRESS NOTE  Patient Care Team: Laurann Montana, MD as PCP - General (Family Medicine)  Hematological/Oncological History # Chronic Myeloid Leukemia, Chronic Phase 1) 06/04/2016: WBC count 15.5, ANC 12.9 2) 01/17/2020: WBC 18.9, Hgb 16.2, Plt 537, MCV 86.7 3) 02/01/2020: establish care with Dr. Pamelia Hoit. Flow cytometry showed no abnormalities. JAK2 negative. BCR/ABL PCR showed 90.67% positive nuclei for the fusion. Diagnosed with CML 4) 02/21/2020: establish care with Dr. Leonides Schanz 5) 03/05/2020: Bone marrow biopsy performed, findings consistent with CML. Started Dasatinib 100mg  PO daily.  6) 04/03/2020: complete hematological response with normalization of blood counts.  7) 05/30/2020: BCR/ABL PCR shows undetectable levels 8) 08/29/2020: BCR/ABL PCR shows undetectable levels 9) 11/27/2020: BCR/ABL PCR shows 0.0059% 10) 02/26/2021: BCR/ABL PCR shows detected below 0.002% 11) 05/28/2021: BCR/ABL PCR shows 0.0094% 12) 02/25/2022: BCR/ABL PCR shows undetectable levels 13) 05/27/2022: BCR/ABL PCR shows undetectable levels 14) 08/26/2022: BCR/ABL PCR shows undetectable levels 15) 12/02/2022: BCR/ABL PCR shows undetectable levels 16) 03/10/2023: BCR/ABL PCR shows undetectable levels  Interval History:  Allen Adams 57 y.o. male with medical history significant for CML presents for a follow up visit. The patient's last visit was on 03/10/2023. In the interim since the last visit he has been well with no major changes in his health.   On exam today Mr. Beans reports that he is doing well without any new or concerning symptoms. He is tolerating dasatinib without any significant toxicities. His appetite and energy levels are stable. He was treated for cellulitis in his left leg in June 2024. He adds that the redness in his leg is improving and completion of antibiotic therapy. He denies nausea, vomiting or bowel habit changes. He denies easy  bruising or signs of active bleeding. He denies fevers, chills, sweats, shortness of breath, chest pain or cough. Overall he is willing and able to proceed with Dasatinib therapy at this time. A full 10 point ROS is listed below.    MEDICAL HISTORY:  Past Medical History:  Diagnosis Date   Anxiety    Atrial fibrillation (HCC)    Hypertriglyceridemia    OSA (obstructive sleep apnea)    (SPLIT 03/05/11, ESS 6, AHI 41/hr REM 90/hr, RDI 83/hr REM 120/hr, O2 nadir 83%; CPAP 9 with AHI 0/hr), Dr Earl Gala    SURGICAL HISTORY: Past Surgical History:  Procedure Laterality Date   Lasik like eye surgery, PPK     lumbar back surgery      SOCIAL HISTORY: Social History   Socioeconomic History   Marital status: Married    Spouse name: Not on file   Number of children: Not on file   Years of education: Not on file   Highest education level: Not on file  Occupational History   Not on file  Tobacco Use   Smoking status: Never   Smokeless tobacco: Never  Substance and Sexual Activity   Alcohol use: Not on file   Drug use: Not on file   Sexual activity: Not on file  Other Topics Concern   Not on file  Social History Narrative   Not on file   Social Determinants of Health   Financial Resource Strain: Not on file  Food Insecurity: Not on file  Transportation Needs: Not on file  Physical Activity: Not on file  Stress: Not on file  Social Connections: Not on file  Intimate Partner Violence: Low Risk  (05/12/2020)   Received from [facility]  Intimate Partner Violence    Insults You: Not on file    Threatens You: Not on file    Screams at [place]: Not on file    Physically Hurt: Not on file    Intimate Partner Violence Score: Not on file    FAMILY HISTORY: Family History  Problem Relation Age of Onset   Stroke Mother    Cancer Father    Heart attack Maternal Grandfather     ALLERGIES:  has No Known Allergies.  MEDICATIONS:  Current Outpatient Medications   Medication Sig Dispense Refill   aspirin 81 MG tablet Take 81 mg by mouth daily.     dasatinib (SPRYCEL) 100 MG tablet Take 1 tablet (100 mg total) by mouth daily. 90 tablet 2   No current facility-administered medications for this visit.    REVIEW OF SYSTEMS:   Constitutional: ( - ) fevers, ( - )  chills , ( - ) night sweats Eyes: ( - ) blurriness of vision, ( - ) double vision, ( - ) watery eyes Ears, nose, mouth, throat, and face: ( - ) mucositis, ( - ) sore throat Respiratory: ( - ) cough, ( - ) dyspnea, ( - ) wheezes Cardiovascular: ( - ) palpitation, ( - ) chest discomfort, ( - ) lower extremity swelling Gastrointestinal:  ( - ) nausea, ( - ) heartburn, ( - ) change in bowel habits Skin: ( - ) abnormal skin rashes Lymphatics: ( - ) new lymphadenopathy, ( - ) easy bruising Neurological: ( - ) numbness, ( - ) tingling, ( - ) new weaknesses Behavioral/Psych: ( - ) mood change, ( - ) new changes  All other systems were reviewed with the patient and are negative.  PHYSICAL EXAMINATION: ECOG PERFORMANCE STATUS: 0 - Asymptomatic  Vitals:   06/09/23 0803  BP: 132/72  Pulse: 81  Temp: 98.2 F (36.8 C)  SpO2: 100%    Filed Weights   06/09/23 0803  Weight: 242 lb 3.2 oz (109.9 kg)     GENERAL: well appearing middle aged Caucasian male. Alert, no distress and comfortable SKIN: skin color, texture, turgor are normal, no rashes or significant lesions. Mild erythema in anterior portion of left lower leg.  EYES: conjunctiva are pink and non-injected, sclera clear LUNGS: clear to auscultation and percussion with normal breathing effort HEART: regular rate & rhythm and no murmurs and no lower extremity edema Musculoskeletal: no cyanosis of digits and no clubbing.  PSYCH: alert & oriented x 3, fluent speech NEURO: no focal motor/sensory deficits  LABORATORY DATA:  I have reviewed the data as listed    Latest Ref Rng & Units 06/09/2023    7:44 AM 03/10/2023    8:41 AM 12/02/2022     8:07 AM  CBC  WBC 4.0 - 10.5 K/uL 4.0  5.5  5.1   Hemoglobin 13.0 - 17.0 g/dL 30.1  60.1  09.3   Hematocrit 39.0 - 52.0 % 45.6  47.7  44.9   Platelets 150 - 400 K/uL 183  238  219        Latest Ref Rng & Units 06/09/2023    7:44 AM 03/10/2023    8:41 AM 12/02/2022    8:07 AM  CMP  Glucose 70 - 99 mg/dL 235  573  95   BUN 6 - 20 mg/dL 17  17  15    Creatinine 0.61 - 1.24 mg/dL 2.20  2.54  2.70   Sodium 135 - 145 mmol/L 140  140  139  Potassium 3.5 - 5.1 mmol/L 4.7  4.4  4.1   Chloride 98 - 111 mmol/L 107  107  107   CO2 22 - 32 mmol/L 24  26  25    Calcium 8.9 - 10.3 mg/dL 8.7  9.1  9.0   Total Protein 6.5 - 8.1 g/dL 6.9  7.0  6.3   Total Bilirubin 0.3 - 1.2 mg/dL 0.5  0.5  0.5   Alkaline Phos 38 - 126 U/L 63  56  63   AST 15 - 41 U/L 17  16  16    ALT 0 - 44 U/L 16  15  17      RADIOGRAPHIC STUDIES: No results found.  ASSESSMENT & PLAN Allen Adams 57 y.o. male with medical history significant for CML presents for a follow up visit.    #CML, Chronic Phase --after 3 years of treatment (May 2024), patient requested to continue Dasatinib therapy.  This is a reasonable choice as approximately 50% of patients relapse after discontinuation of the medication and require restarting and indefinite treatment anyway. --Labs today show white blood cell count 4.0, hemoglobin 14.7, MCV 93.4, and platelets of 183. Creatinine stable at 1.29. LFTs normal. Awaiting BCR/ABL PCR to evaluate for molecular response.  --Continue Dasatinib 100 mg PO daily without any dose modifications. Refill sent today --RTC in 12 weeks for labs   No orders of the defined types were placed in this encounter.  All questions were answered. The patient knows to call the clinic with any problems, questions or concerns.  I have spent a total of 30 minutes minutes of face-to-face and non-face-to-face time, preparing to see the patient, performing a medically appropriate examination, counseling and educating the  patient, ordering medications, documenting clinical information in the electronic health record, independently interpreting results and communicating results to the patient, and care coordination.   Georga Kaufmann PA-C Dept of Hematology and Oncology [facility] at [facility] Phone: [phone]   06/09/2023 8:47 AM

## 2023-06-14 LAB — BCR/ABL

## 2023-07-14 DIAGNOSIS — G4733 Obstructive sleep apnea (adult) (pediatric): Secondary | ICD-10-CM | POA: Diagnosis not present

## 2023-07-18 DIAGNOSIS — G4733 Obstructive sleep apnea (adult) (pediatric): Secondary | ICD-10-CM | POA: Diagnosis not present

## 2023-08-02 DIAGNOSIS — H2511 Age-related nuclear cataract, right eye: Secondary | ICD-10-CM | POA: Diagnosis not present

## 2023-08-13 DIAGNOSIS — G4733 Obstructive sleep apnea (adult) (pediatric): Secondary | ICD-10-CM | POA: Diagnosis not present

## 2023-09-07 ENCOUNTER — Other Ambulatory Visit: Payer: Self-pay | Admitting: Nurse Practitioner

## 2023-09-07 DIAGNOSIS — C9210 Chronic myeloid leukemia, BCR/ABL-positive, not having achieved remission: Secondary | ICD-10-CM

## 2023-09-08 ENCOUNTER — Inpatient Hospital Stay: Payer: BC Managed Care – PPO | Admitting: Hematology and Oncology

## 2023-09-08 ENCOUNTER — Inpatient Hospital Stay: Payer: BC Managed Care – PPO | Attending: Hematology and Oncology

## 2023-09-08 VITALS — BP 142/70 | HR 82 | Temp 98.3°F | Resp 16 | Wt 250.9 lb

## 2023-09-08 DIAGNOSIS — Z809 Family history of malignant neoplasm, unspecified: Secondary | ICD-10-CM | POA: Insufficient documentation

## 2023-09-08 DIAGNOSIS — C9210 Chronic myeloid leukemia, BCR/ABL-positive, not having achieved remission: Secondary | ICD-10-CM | POA: Insufficient documentation

## 2023-09-08 DIAGNOSIS — Z823 Family history of stroke: Secondary | ICD-10-CM | POA: Insufficient documentation

## 2023-09-08 DIAGNOSIS — E781 Pure hyperglyceridemia: Secondary | ICD-10-CM | POA: Insufficient documentation

## 2023-09-08 DIAGNOSIS — Z79899 Other long term (current) drug therapy: Secondary | ICD-10-CM | POA: Insufficient documentation

## 2023-09-08 DIAGNOSIS — Z8249 Family history of ischemic heart disease and other diseases of the circulatory system: Secondary | ICD-10-CM | POA: Diagnosis not present

## 2023-09-08 DIAGNOSIS — R7989 Other specified abnormal findings of blood chemistry: Secondary | ICD-10-CM | POA: Diagnosis not present

## 2023-09-08 LAB — CMP (CANCER CENTER ONLY)
ALT: 18 U/L (ref 0–44)
AST: 21 U/L (ref 15–41)
Albumin: 4.3 g/dL (ref 3.5–5.0)
Alkaline Phosphatase: 57 U/L (ref 38–126)
Anion gap: 7 (ref 5–15)
BUN: 20 mg/dL (ref 6–20)
CO2: 27 mmol/L (ref 22–32)
Calcium: 8.8 mg/dL — ABNORMAL LOW (ref 8.9–10.3)
Chloride: 106 mmol/L (ref 98–111)
Creatinine: 1.47 mg/dL — ABNORMAL HIGH (ref 0.61–1.24)
GFR, Estimated: 55 mL/min — ABNORMAL LOW (ref >60)
Glucose, Bld: 121 mg/dL — ABNORMAL HIGH (ref 70–99)
Potassium: 4.1 mmol/L (ref 3.5–5.1)
Sodium: 140 mmol/L (ref 135–145)
Total Bilirubin: 0.6 mg/dL (ref <1.2)
Total Protein: 6.8 g/dL (ref 6.5–8.1)

## 2023-09-08 LAB — CBC WITH DIFFERENTIAL (CANCER CENTER ONLY)
Abs Immature Granulocytes: 0.01 10*3/uL (ref 0.00–0.07)
Basophils Absolute: 0 10*3/uL (ref 0.0–0.1)
Basophils Relative: 1 %
Eosinophils Absolute: 0 10*3/uL (ref 0.0–0.5)
Eosinophils Relative: 0 %
HCT: 44 % (ref 39.0–52.0)
Hemoglobin: 14.7 g/dL (ref 13.0–17.0)
Immature Granulocytes: 0 %
Lymphocytes Relative: 22 %
Lymphs Abs: 1.2 10*3/uL (ref 0.7–4.0)
MCH: 30.2 pg (ref 26.0–34.0)
MCHC: 33.4 g/dL (ref 30.0–36.0)
MCV: 90.5 fL (ref 80.0–100.0)
Monocytes Absolute: 0.5 10*3/uL (ref 0.1–1.0)
Monocytes Relative: 10 %
Neutro Abs: 3.7 10*3/uL (ref 1.7–7.7)
Neutrophils Relative %: 67 %
Platelet Count: 153 10*3/uL (ref 150–400)
RBC: 4.86 MIL/uL (ref 4.22–5.81)
RDW: 14.2 % (ref 11.5–15.5)
WBC Count: 5.5 10*3/uL (ref 4.0–10.5)
nRBC: 0 % (ref 0.0–0.2)

## 2023-09-08 MED ORDER — DASATINIB 100 MG PO TABS: 100.0000 mg | ORAL_TABLET | Freq: Every day | ORAL | 2 refills | Status: DC

## 2023-09-08 NOTE — Progress Notes (Signed)
[facility] Telephone:(336) [phone]   Fax:(336) [phone]  PROGRESS NOTE  Patient Care Team: Laurann Montana, MD as PCP - General (Family Medicine)  Hematological/Oncological History # Chronic Myeloid Leukemia, Chronic Phase 1) 06/04/2016: WBC count 15.5, ANC 12.9 2) 01/17/2020: WBC 18.9, Hgb 16.2, Plt 537, MCV 86.7 3) 02/01/2020: establish care with Dr. Pamelia Hoit. Flow cytometry showed no abnormalities. JAK2 negative. BCR/ABL PCR showed 90.67% positive nuclei for the fusion. Diagnosed with CML 4) 02/21/2020: establish care with Dr. Leonides Schanz 5) 03/05/2020: Bone marrow biopsy performed, findings consistent with CML. Started Dasatinib 100mg  PO daily.  6) 04/03/2020: complete hematological response with normalization of blood counts.  7) 05/30/2020: BCR/ABL PCR shows undetectable levels 8) 08/29/2020: BCR/ABL PCR shows undetectable levels 9) 11/27/2020: BCR/ABL PCR shows 0.0059% 10) 02/26/2021: BCR/ABL PCR shows detected below 0.002% 11) 05/28/2021: BCR/ABL PCR shows 0.0094% 12) 02/25/2022: BCR/ABL PCR shows undetectable levels 13) 05/27/2022: BCR/ABL PCR shows undetectable levels 14) 08/26/2022: BCR/ABL PCR shows undetectable levels 15) 12/02/2022: BCR/ABL PCR shows undetectable levels 16) 03/10/2023: BCR/ABL PCR shows undetectable levels  Interval History:  Allen Adams 57 y.o. male with medical history significant for CML presents for a follow up visit. The patient's last visit was on 06/09/2023. In the interim since the last visit he has been well with no major changes in his health.   On exam today Allen Adams reports he has been well overall in the interim since our last visit.  He reports that he did have an infection of his left lower extremity back in June 2024.  He was on 2 different antibiotic therapies and reports that symptoms have resolved though he does still have some chronic discoloration of the left ankle.  He reports he is not otherwise had any runny nose, sore throat, or cough.  He  reports his energy levels are good and his appetite is strong.  He is having no trouble with the Sprycel pills.  He reports his co-pay is $0 per month and is able to for the medication without any difficulty.  Overall he feels quite well and is willing and able to proceed with Dasatinib therapy at this time.  He denies fevers, chills, sweats, shortness of breath, chest pain or cough. A full 10 point ROS is listed below.    MEDICAL HISTORY:  Past Medical History:  Diagnosis Date   Anxiety    Atrial fibrillation (HCC)    Hypertriglyceridemia    OSA (obstructive sleep apnea)    (SPLIT 03/05/11, ESS 6, AHI 41/hr REM 90/hr, RDI 83/hr REM 120/hr, O2 nadir 83%; CPAP 9 with AHI 0/hr), Dr Earl Gala    SURGICAL HISTORY: Past Surgical History:  Procedure Laterality Date   Lasik like eye surgery, PPK     lumbar back surgery      SOCIAL HISTORY: Social History   Socioeconomic History   Marital status: Married    Spouse name: Not on file   Number of children: Not on file   Years of education: Not on file   Highest education level: Not on file  Occupational History   Not on file  Tobacco Use   Smoking status: Never   Smokeless tobacco: Never  Substance and Sexual Activity   Alcohol use: Not on file   Drug use: Not on file   Sexual activity: Not on file  Other Topics Concern   Not on file  Social History Narrative   Not on file   Social Determinants of Health   Financial Resource Strain: Not  on file  Food Insecurity: Not on file  Transportation Needs: Not on file  Physical Activity: Not on file  Stress: Not on file (08/31/2023)  Social Connections: Not on file  Intimate Partner Violence: Low Risk  (05/12/2020)   Received from [facility]   Intimate Partner Violence    Insults You: Not on file    Threatens You: Not on file    Screams at You: Not on file    Physically Hurt: Not on file    Intimate Partner Violence Score: Not on file    FAMILY HISTORY: Family  History  Problem Relation Age of Onset   Stroke Mother    Cancer Father    Heart attack Maternal Grandfather     ALLERGIES:  has No Known Allergies.  MEDICATIONS:  Current Outpatient Medications  Medication Sig Dispense Refill   aspirin 81 MG tablet Take 81 mg by mouth daily.     dasatinib (SPRYCEL) 100 MG tablet Take 1 tablet (100 mg total) by mouth daily. 90 tablet 2   No current facility-administered medications for this visit.    REVIEW OF SYSTEMS:   Constitutional: ( - ) fevers, ( - )  chills , ( - ) night sweats Eyes: ( - ) blurriness of vision, ( - ) double vision, ( - ) watery eyes Ears, nose, mouth, throat, and face: ( - ) mucositis, ( - ) sore throat Respiratory: ( - ) cough, ( - ) dyspnea, ( - ) wheezes Cardiovascular: ( - ) palpitation, ( - ) chest discomfort, ( - ) lower extremity swelling Gastrointestinal:  ( - ) nausea, ( - ) heartburn, ( - ) change in bowel habits Skin: ( - ) abnormal skin rashes Lymphatics: ( - ) new lymphadenopathy, ( - ) easy bruising Neurological: ( - ) numbness, ( - ) tingling, ( - ) new weaknesses Behavioral/Psych: ( - ) mood change, ( - ) new changes  All other systems were reviewed with the patient and are negative.  PHYSICAL EXAMINATION: ECOG PERFORMANCE STATUS: 0 - Asymptomatic  Vitals:   09/08/23 0838  BP: (!) 142/70  Pulse: 82  Resp: 16  Temp: 98.3 F (36.8 C)  SpO2: 100%     Filed Weights   09/08/23 0838  Weight: 250 lb 14.4 oz (113.8 kg)      GENERAL: well appearing middle aged Caucasian male. Alert, no distress and comfortable SKIN: skin color, texture, turgor are normal, no rashes or significant lesions. Mild erythema in anterior portion of left lower leg.  EYES: conjunctiva are pink and non-injected, sclera clear LUNGS: clear to auscultation and percussion with normal breathing effort HEART: regular rate & rhythm and no murmurs and no lower extremity edema Musculoskeletal: no cyanosis of digits and no clubbing.   PSYCH: alert & oriented x 3, fluent speech NEURO: no focal motor/sensory deficits  LABORATORY DATA:  I have reviewed the data as listed    Latest Ref Rng & Units 09/08/2023    8:08 AM 06/09/2023    7:44 AM 03/10/2023    8:41 AM  CBC  WBC 4.0 - 10.5 K/uL 5.5  4.0  5.5   Hemoglobin 13.0 - 17.0 g/dL 46.9  62.9  52.8   Hematocrit 39.0 - 52.0 % 44.0  45.6  47.7   Platelets 150 - 400 K/uL 153  183  238        Latest Ref Rng & Units 09/08/2023    8:08 AM 06/09/2023    7:44  AM 03/10/2023    8:41 AM  CMP  Glucose 70 - 99 mg/dL 846  962  952   BUN 6 - 20 mg/dL 20  17  17    Creatinine 0.61 - 1.24 mg/dL 8.41  3.24  4.01   Sodium 135 - 145 mmol/L 140  140  140   Potassium 3.5 - 5.1 mmol/L 4.1  4.7  4.4   Chloride 98 - 111 mmol/L 106  107  107   CO2 22 - 32 mmol/L 27  24  26    Calcium 8.9 - 10.3 mg/dL 8.8  8.7  9.1   Total Protein 6.5 - 8.1 g/dL 6.8  6.9  7.0   Total Bilirubin <1.2 mg/dL 0.6  0.5  0.5   Alkaline Phos 38 - 126 U/L 57  63  56   AST 15 - 41 U/L 21  17  16    ALT 0 - 44 U/L 18  16  15      RADIOGRAPHIC STUDIES: No results found.  ASSESSMENT & PLAN Allen Adams 57 y.o. male with medical history significant for CML presents for a follow up visit.    #CML, Chronic Phase --after 3 years of treatment (May 2024), patient requested to continue Dasatinib therapy.  This is a reasonable choice as approximately 50% of patients relapse after discontinuation of the medication and require restarting and indefinite treatment anyway. --Labs today show white blood cell count 5.5, hemoglobin 14.7, MCV 90.5, platelets 153. Creatinine mildly elevated at 1.47. LFTs normal. Awaiting BCR/ABL PCR to evaluate for molecular response.  Previous results showed undetectable levels of BCR-ABL --Continue Dasatinib 100 mg PO daily without any dose modifications.  --RTC in 12 weeks for labs and clinic visit    No orders of the defined types were placed in this encounter.  All questions were  answered. The patient knows to call the clinic with any problems, questions or concerns.  I have spent a total of 30 minutes minutes of face-to-face and non-face-to-face time, preparing to see the patient, performing a medically appropriate examination, counseling and educating the patient, ordering medications, documenting clinical information in the electronic health record, independently interpreting results and communicating results to the patient, and care coordination.   Ulysees Barns, MD Department of Hematology/Oncology [facility] at [facility] Phone: [phone] Pager: [phone] Email: [email or web address]    09/08/2023 9:31 AM

## 2023-09-13 DIAGNOSIS — G4733 Obstructive sleep apnea (adult) (pediatric): Secondary | ICD-10-CM | POA: Diagnosis not present

## 2023-09-13 LAB — BCR/ABL

## 2023-09-15 ENCOUNTER — Telehealth: Payer: Self-pay | Admitting: *Deleted

## 2023-09-15 NOTE — Telephone Encounter (Signed)
TCT patient regarding recent lab results.  No answer but was able to leave detailed vm on identified phone vm .  Advised that his BCR/ABL remains undetectable. He is to continue his current therapy.

## 2023-09-15 NOTE — Telephone Encounter (Signed)
-----   Message from Pollyann Samples sent at 09/15/2023  8:35 AM EST ----- My nurse,  Please let patient know BCR/ABL remains undetectable, he continues to have molecular response. Continue dasatinib per Dr. Leonides Schanz.  Thanks, Clayborn Heron NP

## 2023-09-27 ENCOUNTER — Other Ambulatory Visit (HOSPITAL_COMMUNITY): Payer: Self-pay

## 2023-10-10 DIAGNOSIS — H18513 Endothelial corneal dystrophy, bilateral: Secondary | ICD-10-CM | POA: Diagnosis not present

## 2023-10-10 DIAGNOSIS — Z9889 Other specified postprocedural states: Secondary | ICD-10-CM | POA: Diagnosis not present

## 2023-10-10 DIAGNOSIS — H179 Unspecified corneal scar and opacity: Secondary | ICD-10-CM | POA: Diagnosis not present

## 2023-10-10 DIAGNOSIS — H2513 Age-related nuclear cataract, bilateral: Secondary | ICD-10-CM | POA: Diagnosis not present

## 2023-10-12 DIAGNOSIS — G4733 Obstructive sleep apnea (adult) (pediatric): Secondary | ICD-10-CM | POA: Diagnosis not present

## 2023-10-17 DIAGNOSIS — G4733 Obstructive sleep apnea (adult) (pediatric): Secondary | ICD-10-CM | POA: Diagnosis not present

## 2023-11-12 DIAGNOSIS — G4733 Obstructive sleep apnea (adult) (pediatric): Secondary | ICD-10-CM | POA: Diagnosis not present

## 2023-12-08 ENCOUNTER — Inpatient Hospital Stay (HOSPITAL_BASED_OUTPATIENT_CLINIC_OR_DEPARTMENT_OTHER): Payer: BC Managed Care – PPO | Admitting: Hematology and Oncology

## 2023-12-08 ENCOUNTER — Other Ambulatory Visit: Payer: Self-pay | Admitting: Hematology and Oncology

## 2023-12-08 ENCOUNTER — Inpatient Hospital Stay: Payer: BC Managed Care – PPO | Attending: Hematology and Oncology

## 2023-12-08 ENCOUNTER — Other Ambulatory Visit: Payer: Self-pay | Admitting: *Deleted

## 2023-12-08 VITALS — BP 135/92 | HR 87 | Temp 98.7°F | Wt 256.0 lb

## 2023-12-08 DIAGNOSIS — Z823 Family history of stroke: Secondary | ICD-10-CM | POA: Diagnosis not present

## 2023-12-08 DIAGNOSIS — Z809 Family history of malignant neoplasm, unspecified: Secondary | ICD-10-CM | POA: Insufficient documentation

## 2023-12-08 DIAGNOSIS — Z7982 Long term (current) use of aspirin: Secondary | ICD-10-CM | POA: Insufficient documentation

## 2023-12-08 DIAGNOSIS — Z8249 Family history of ischemic heart disease and other diseases of the circulatory system: Secondary | ICD-10-CM | POA: Diagnosis not present

## 2023-12-08 DIAGNOSIS — C9210 Chronic myeloid leukemia, BCR/ABL-positive, not having achieved remission: Secondary | ICD-10-CM | POA: Insufficient documentation

## 2023-12-08 DIAGNOSIS — Z79899 Other long term (current) drug therapy: Secondary | ICD-10-CM | POA: Insufficient documentation

## 2023-12-08 DIAGNOSIS — R519 Headache, unspecified: Secondary | ICD-10-CM | POA: Diagnosis not present

## 2023-12-08 LAB — CMP (CANCER CENTER ONLY)
ALT: 14 U/L (ref 0–44)
AST: 16 U/L (ref 15–41)
Albumin: 4.4 g/dL (ref 3.5–5.0)
Alkaline Phosphatase: 62 U/L (ref 38–126)
Anion gap: 7 (ref 5–15)
BUN: 20 mg/dL (ref 6–20)
CO2: 26 mmol/L (ref 22–32)
Calcium: 8.7 mg/dL — ABNORMAL LOW (ref 8.9–10.3)
Chloride: 107 mmol/L (ref 98–111)
Creatinine: 1.36 mg/dL — ABNORMAL HIGH (ref 0.61–1.24)
GFR, Estimated: >60 mL/min (ref >60)
Glucose, Bld: 100 mg/dL — ABNORMAL HIGH (ref 70–99)
Potassium: 3.8 mmol/L (ref 3.5–5.1)
Sodium: 140 mmol/L (ref 135–145)
Total Bilirubin: 0.6 mg/dL (ref 0.0–1.2)
Total Protein: 6.7 g/dL (ref 6.5–8.1)

## 2023-12-08 LAB — CBC WITH DIFFERENTIAL (CANCER CENTER ONLY)
Abs Immature Granulocytes: 0.01 10*3/uL (ref 0.00–0.07)
Basophils Absolute: 0.1 10*3/uL (ref 0.0–0.1)
Basophils Relative: 1 %
Eosinophils Absolute: 0.1 10*3/uL (ref 0.0–0.5)
Eosinophils Relative: 2 %
HCT: 44.4 % (ref 39.0–52.0)
Hemoglobin: 14.6 g/dL (ref 13.0–17.0)
Immature Granulocytes: 0 %
Lymphocytes Relative: 27 %
Lymphs Abs: 1.4 10*3/uL (ref 0.7–4.0)
MCH: 30.5 pg (ref 26.0–34.0)
MCHC: 32.9 g/dL (ref 30.0–36.0)
MCV: 92.7 fL (ref 80.0–100.0)
Monocytes Absolute: 0.5 10*3/uL (ref 0.1–1.0)
Monocytes Relative: 10 %
Neutro Abs: 3 10*3/uL (ref 1.7–7.7)
Neutrophils Relative %: 60 %
Platelet Count: 197 10*3/uL (ref 150–400)
RBC: 4.79 MIL/uL (ref 4.22–5.81)
RDW: 14.1 % (ref 11.5–15.5)
WBC Count: 5 10*3/uL (ref 4.0–10.5)
nRBC: 0 % (ref 0.0–0.2)

## 2023-12-08 MED ORDER — DASATINIB 100 MG PO TABS: 100.0000 mg | ORAL_TABLET | Freq: Every day | ORAL | 2 refills | Status: DC

## 2023-12-08 NOTE — Progress Notes (Signed)
[facility] [phone]   [phone]  PROGRESS NOTE  Patient Care Team: Laurann [place], MD as PCP - General (Family Medicine)  Hematological/Oncological History # Chronic Myeloid Leukemia, Chronic Phase 1) 06/04/2016: WBC count 15.5, ANC 12.9 2) 01/17/2020: WBC 18.9, Hgb 16.2, Plt 537, MCV 86.7 3) 02/01/2020: establish care with Dr. Pamelia Hoit. Flow cytometry showed no abnormalities. JAK2 negative. BCR/ABL PCR showed 90.67% positive nuclei for the fusion. Diagnosed with CML 4) 02/21/2020: establish care with Dr. Leonides Schanz 5) 03/05/2020: Bone marrow biopsy performed, findings consistent with CML. Started Dasatinib 100mg  PO daily.  6) 04/03/2020: complete hematological response with normalization of blood counts.  7) 05/30/2020: BCR/ABL PCR shows undetectable levels 8) 08/29/2020: BCR/ABL PCR shows undetectable levels 9) 11/27/2020: BCR/ABL PCR shows 0.0059% 10) 02/26/2021: BCR/ABL PCR shows detected below 0.002% 11) 05/28/2021: BCR/ABL PCR shows 0.0094% 12) 02/25/2022: BCR/ABL PCR shows undetectable levels 13) 05/27/2022: BCR/ABL PCR shows undetectable levels 14) 08/26/2022: BCR/ABL PCR shows undetectable levels 15) 12/02/2022: BCR/ABL PCR shows undetectable levels 16) 03/10/2023: BCR/ABL PCR shows undetectable levels  Interval History:  Allen Adams 58 y.o. male with medical history significant for CML presents for a follow up visit. The patient's last visit was on 09/08/2023. In the interim since the last visit he has been well with no major changes in his health.   On exam today Allen Adams reports he has been well overall in the interim since our last visit. He reports he transitioned to the generic dasatinib and hasn't had any issues with the change.  He reports he has not had any issues with runny nose, sore throat, cough but does have some occasional sinus headaches.  He reports his energy levels are great and currently reports his energy is an 8 out of 10.  His appetite  has been strong and he is not having any trouble with bleeding, bruising, or dark stools.  He reports that his only issue right now is he has too many Funko pops and is up to a collection of 1300.  He reports he is tolerating Sprycel well and is willing able to continue.  His co-pay is currently $0..  He reports his co-pay is $0 per month and is able to for the medication without any difficulty.  Overall he feels quite well and is willing and able to proceed with Dasatinib therapy at this time.  He denies fevers, chills, sweats, shortness of breath, chest pain or cough. A full 10 point ROS is listed below.    MEDICAL HISTORY:  Past Medical History:  Diagnosis Date   Anxiety    Atrial fibrillation (HCC)    Hypertriglyceridemia    OSA (obstructive sleep apnea)    (SPLIT 03/05/11, ESS 6, AHI 41/hr REM 90/hr, RDI 83/hr REM 120/hr, O2 nadir 83%; CPAP 9 with AHI 0/hr), Dr Earl Gala    SURGICAL HISTORY: Past Surgical History:  Procedure Laterality Date   Lasik like eye surgery, PPK     lumbar back surgery      SOCIAL HISTORY: Social History   Socioeconomic History   Marital status: Married    Spouse name: Not on file   Number of children: Not on file   Years of education: Not on file   Highest education level: Not on file  Occupational History   Not on file  Tobacco Use   Smoking status: Never   Smokeless tobacco: Never  Substance and Sexual Activity   Alcohol use: Not on file   Drug use: Not on  file   Sexual activity: Not on file  Other Topics Concern   Not on file  Social History Narrative   Not on file   Social Drivers of Health   Financial Resource Strain: Not on file  Food Insecurity: Not on file  Transportation Needs: Not on file  Physical Activity: Not on file  Stress: Not on file (08/31/2023)  Social Connections: Not on file  Intimate Partner Violence: Low Risk  (05/12/2020)   Received from [facility]   Intimate Partner Violence    Insults You: Not  on file    Threatens You: Not on file    Screams at [place]: Not on file    Physically Hurt: Not on file    Intimate Partner Violence Score: Not on file    FAMILY HISTORY: Family History  Problem Relation Age of Onset   Stroke Mother    Cancer Father    Heart attack Maternal Grandfather     ALLERGIES:  has no known allergies.  MEDICATIONS:  Current Outpatient Medications  Medication Sig Dispense Refill   aspirin 81 MG tablet Take 81 mg by mouth daily.     dasatinib (SPRYCEL) 100 MG tablet Take 1 tablet (100 mg total) by mouth daily. 90 tablet 2   No current facility-administered medications for this visit.    REVIEW OF SYSTEMS:   Constitutional: ( - ) fevers, ( - )  chills , ( - ) night sweats Eyes: ( - ) blurriness of vision, ( - ) double vision, ( - ) watery eyes Ears, nose, mouth, throat, and face: ( - ) mucositis, ( - ) sore throat Respiratory: ( - ) cough, ( - ) dyspnea, ( - ) wheezes Cardiovascular: ( - ) palpitation, ( - ) chest discomfort, ( - ) lower extremity swelling Gastrointestinal:  ( - ) nausea, ( - ) heartburn, ( - ) change in bowel habits Skin: ( - ) abnormal skin rashes Lymphatics: ( - ) new lymphadenopathy, ( - ) easy bruising Neurological: ( - ) numbness, ( - ) tingling, ( - ) new weaknesses Behavioral/Psych: ( - ) mood change, ( - ) new changes  All other systems were reviewed with the patient and are negative.  PHYSICAL EXAMINATION: ECOG PERFORMANCE STATUS: 0 - Asymptomatic  Vitals:   12/08/23 0832  BP: (!) 135/92  Pulse: 87  Temp: 98.7 F (37.1 C)  SpO2: 100%   Filed Weights   12/08/23 0832  Weight: 256 lb (116.1 kg)     GENERAL: well appearing middle aged Caucasian male. Alert, no distress and comfortable SKIN: skin color, texture, turgor are normal, no rashes or significant lesions. Mild erythema in anterior portion of left lower leg.  EYES: conjunctiva are pink and non-injected, sclera clear LUNGS: clear to auscultation and percussion  with normal breathing effort HEART: regular rate & rhythm and no murmurs and no lower extremity edema Musculoskeletal: no cyanosis of digits and no clubbing.  PSYCH: alert & oriented x 3, fluent speech NEURO: no focal motor/sensory deficits  LABORATORY DATA:  I have reviewed the data as listed    Latest Ref Rng & Units 12/08/2023    8:03 AM 09/08/2023    8:08 AM 06/09/2023    7:44 AM  CBC  WBC 4.0 - 10.5 K/uL 5.0  5.5  4.0   Hemoglobin 13.0 - 17.0 g/dL 16.1  09.6  04.5   Hematocrit 39.0 - 52.0 % 44.4  44.0  45.6   Platelets 150 -  400 K/uL 197  153  183        Latest Ref Rng & Units 12/08/2023    8:03 AM 09/08/2023    8:08 AM 06/09/2023    7:44 AM  CMP  Glucose 70 - 99 mg/dL 161  096  045   BUN 6 - 20 mg/dL 20  20  17    Creatinine 0.61 - 1.24 mg/dL 4.09  8.11  9.14   Sodium 135 - 145 mmol/L 140  140  140   Potassium 3.5 - 5.1 mmol/L 3.8  4.1  4.7   Chloride 98 - 111 mmol/L 107  106  107   CO2 22 - 32 mmol/L 26  27  24    Calcium 8.9 - 10.3 mg/dL 8.7  8.8  8.7   Total Protein 6.5 - 8.1 g/dL 6.7  6.8  6.9   Total Bilirubin 0.0 - 1.2 mg/dL 0.6  0.6  0.5   Alkaline Phos 38 - 126 U/L 62  57  63   AST 15 - 41 U/L 16  21  17    ALT 0 - 44 U/L 14  18  16      RADIOGRAPHIC STUDIES: No results found.  ASSESSMENT & PLAN HAI GRABE 58 y.o. male with medical history significant for CML presents for a follow up visit.    #CML, Chronic Phase --after 3 years of treatment (May 2024), patient requested to continue Dasatinib therapy.  This is a reasonable choice as approximately 50% of patients relapse after discontinuation of the medication and require restarting and indefinite treatment anyway. --Labs today show white blood cell count 5.0, Hgb 14.6, MCV 92.7, Plt 197. Creatinine mildly elevated at 1.36. LFTs normal. Awaiting BCR/ABL PCR to evaluate for molecular response.  Previous results showed undetectable levels of BCR-ABL --Continue Dasatinib 100 mg PO daily without any dose  modifications.  --RTC in 12 weeks for labs and clinic visit    No orders of the defined types were placed in this encounter.  All questions were answered. The patient knows to call the clinic with any problems, questions or concerns.  I have spent a total of 25 minutes minutes of face-to-face and non-face-to-face time, preparing to see the patient, performing a medically appropriate examination, counseling and educating the patient, ordering medications, documenting clinical information in the electronic health record, independently interpreting results and communicating results to the patient, and care coordination.   Ulysees Barns, MD Department of Hematology/Oncology [facility] at [facility] Phone: [phone] Pager: [phone] Email: [email or web address]  12/08/2023 11:51 AM

## 2023-12-13 DIAGNOSIS — G4733 Obstructive sleep apnea (adult) (pediatric): Secondary | ICD-10-CM | POA: Diagnosis not present

## 2023-12-14 LAB — BCR-ABL1, CML/ALL, PCR, QUANT
E1A2 Transcript: <0.0032 %
Interpretation (BCRAL):: NEGATIVE
b2a2 transcript: <0.0032 %
b3a2 transcript: <0.0032 %

## 2023-12-15 ENCOUNTER — Telehealth: Payer: Self-pay | Admitting: *Deleted

## 2023-12-15 NOTE — Telephone Encounter (Signed)
-----   Message from Ulysees Barns IV sent at 12/14/2023  6:39 PM EST ----- Please let Mr. Gonsoulin know that his BCR/ABL levels remain undetectable. He is doing great. We'll see him back as scheduled in 3 months time. ----- Message ----- From: Leory Plowman, Lab In [place] Sent: 12/08/2023   8:17 AM EST To: Jaci Standard, MD

## 2023-12-15 NOTE — Telephone Encounter (Signed)
 TCT patient regarding recent lab results. No answer but was able to leave detailed vm message on his identified phone. Advised that his BCR/ABL levels remain undetectable. He is doing great. We'll see him back as scheduled in 3 months time. Advised to call back if he has any questions or concerns.

## 2023-12-29 DIAGNOSIS — Z0000 Encounter for general adult medical examination without abnormal findings: Secondary | ICD-10-CM | POA: Diagnosis not present

## 2023-12-29 DIAGNOSIS — E785 Hyperlipidemia, unspecified: Secondary | ICD-10-CM | POA: Diagnosis not present

## 2023-12-29 DIAGNOSIS — Z125 Encounter for screening for malignant neoplasm of prostate: Secondary | ICD-10-CM | POA: Diagnosis not present

## 2024-01-11 DIAGNOSIS — Z9889 Other specified postprocedural states: Secondary | ICD-10-CM | POA: Diagnosis not present

## 2024-01-11 DIAGNOSIS — H2513 Age-related nuclear cataract, bilateral: Secondary | ICD-10-CM | POA: Diagnosis not present

## 2024-01-11 DIAGNOSIS — H18513 Endothelial corneal dystrophy, bilateral: Secondary | ICD-10-CM | POA: Diagnosis not present

## 2024-01-11 DIAGNOSIS — G4733 Obstructive sleep apnea (adult) (pediatric): Secondary | ICD-10-CM | POA: Diagnosis not present

## 2024-01-15 DIAGNOSIS — G4733 Obstructive sleep apnea (adult) (pediatric): Secondary | ICD-10-CM | POA: Diagnosis not present

## 2024-01-30 ENCOUNTER — Telehealth: Payer: Self-pay | Admitting: *Deleted

## 2024-01-30 NOTE — Telephone Encounter (Signed)
 Medication Prior Authorization Status  Processed CoverMyMeds KEY: [id number] Dasatinib 100 mg  Approved Today  Per [employer]  Case ID: [id number]   Effective 12/31/2023 through 01/29/2025.

## 2024-02-11 DIAGNOSIS — G4733 Obstructive sleep apnea (adult) (pediatric): Secondary | ICD-10-CM | POA: Diagnosis not present

## 2024-02-29 DIAGNOSIS — H2513 Age-related nuclear cataract, bilateral: Secondary | ICD-10-CM | POA: Diagnosis not present

## 2024-02-29 DIAGNOSIS — H18513 Endothelial corneal dystrophy, bilateral: Secondary | ICD-10-CM | POA: Diagnosis not present

## 2024-02-29 DIAGNOSIS — Z9889 Other specified postprocedural states: Secondary | ICD-10-CM | POA: Diagnosis not present

## 2024-03-08 ENCOUNTER — Inpatient Hospital Stay: Payer: BC Managed Care – PPO

## 2024-03-08 ENCOUNTER — Inpatient Hospital Stay: Payer: BC Managed Care – PPO | Admitting: Hematology and Oncology

## 2024-03-09 ENCOUNTER — Inpatient Hospital Stay: Admitting: Physician Assistant

## 2024-03-09 ENCOUNTER — Inpatient Hospital Stay: Attending: Hematology and Oncology

## 2024-03-09 DIAGNOSIS — Z8249 Family history of ischemic heart disease and other diseases of the circulatory system: Secondary | ICD-10-CM | POA: Diagnosis not present

## 2024-03-09 DIAGNOSIS — C9210 Chronic myeloid leukemia, BCR/ABL-positive, not having achieved remission: Secondary | ICD-10-CM | POA: Diagnosis not present

## 2024-03-09 DIAGNOSIS — R7989 Other specified abnormal findings of blood chemistry: Secondary | ICD-10-CM | POA: Insufficient documentation

## 2024-03-09 DIAGNOSIS — I4891 Unspecified atrial fibrillation: Secondary | ICD-10-CM | POA: Insufficient documentation

## 2024-03-09 DIAGNOSIS — Z823 Family history of stroke: Secondary | ICD-10-CM | POA: Diagnosis not present

## 2024-03-09 DIAGNOSIS — Z809 Family history of malignant neoplasm, unspecified: Secondary | ICD-10-CM | POA: Insufficient documentation

## 2024-03-09 DIAGNOSIS — Z79899 Other long term (current) drug therapy: Secondary | ICD-10-CM | POA: Diagnosis not present

## 2024-03-09 LAB — CBC WITH DIFFERENTIAL (CANCER CENTER ONLY)
Abs Immature Granulocytes: 0.01 10*3/uL (ref 0.00–0.07)
Basophils Absolute: 0 10*3/uL (ref 0.0–0.1)
Basophils Relative: 1 %
Eosinophils Absolute: 0.1 10*3/uL (ref 0.0–0.5)
Eosinophils Relative: 1 %
HCT: 44.7 % (ref 39.0–52.0)
Hemoglobin: 14.8 g/dL (ref 13.0–17.0)
Immature Granulocytes: 0 %
Lymphocytes Relative: 35 %
Lymphs Abs: 1.8 10*3/uL (ref 0.7–4.0)
MCH: 29.6 pg (ref 26.0–34.0)
MCHC: 33.1 g/dL (ref 30.0–36.0)
MCV: 89.4 fL (ref 80.0–100.0)
Monocytes Absolute: 0.4 10*3/uL (ref 0.1–1.0)
Monocytes Relative: 9 %
Neutro Abs: 2.8 10*3/uL (ref 1.7–7.7)
Neutrophils Relative %: 54 %
Platelet Count: 208 10*3/uL (ref 150–400)
RBC: 5 MIL/uL (ref 4.22–5.81)
RDW: 14.7 % (ref 11.5–15.5)
WBC Count: 5.1 10*3/uL (ref 4.0–10.5)
nRBC: 0 % (ref 0.0–0.2)

## 2024-03-09 LAB — CMP (CANCER CENTER ONLY)
ALT: 16 U/L (ref 0–44)
AST: 17 U/L (ref 15–41)
Albumin: 4.6 g/dL (ref 3.5–5.0)
Alkaline Phosphatase: 61 U/L (ref 38–126)
Anion gap: 8 (ref 5–15)
BUN: 19 mg/dL (ref 6–20)
CO2: 26 mmol/L (ref 22–32)
Calcium: 9 mg/dL (ref 8.9–10.3)
Chloride: 107 mmol/L (ref 98–111)
Creatinine: 1.3 mg/dL — ABNORMAL HIGH (ref 0.61–1.24)
GFR, Estimated: >60 mL/min
Glucose, Bld: 106 mg/dL — ABNORMAL HIGH (ref 70–99)
Potassium: 4 mmol/L (ref 3.5–5.1)
Sodium: 141 mmol/L (ref 135–145)
Total Bilirubin: 0.5 mg/dL (ref 0.0–1.2)
Total Protein: 6.9 g/dL (ref 6.5–8.1)

## 2024-03-09 MED ORDER — DASATINIB 100 MG PO TABS
100.0000 mg | ORAL_TABLET | Freq: Every day | ORAL | 2 refills | Status: DC
Start: 2024-03-09 — End: 2024-06-07

## 2024-03-09 NOTE — Progress Notes (Signed)
 [facility] [phone]   Fax:([phone]  PROGRESS NOTE  Patient Care Team: Victorio Grave, MD as PCP - General (Family Medicine)  Hematological/Oncological History # Chronic Myeloid Leukemia, Chronic Phase 1) 06/04/2016: WBC count 15.5, ANC 12.9 2) 01/17/2020: WBC 18.9, Hgb 16.2, Plt 537, MCV 86.7 3) 02/01/2020: establish care with Dr. Gudena. Flow cytometry showed no abnormalities. JAK2 negative. BCR/ABL PCR showed 90.67% positive nuclei for the fusion. Diagnosed with CML 4) 02/21/2020: establish care with Dr. Rosaline Coma 5) 03/05/2020: Bone marrow biopsy performed, findings consistent with CML. Started Dasatinib  100mg  PO daily.  6) 04/03/2020: complete hematological response with normalization of blood counts.  7) 05/30/2020: BCR/ABL PCR shows undetectable levels 8) 08/29/2020: BCR/ABL PCR shows undetectable levels 9) 11/27/2020: BCR/ABL PCR shows 0.0059% 10) 02/26/2021: BCR/ABL PCR shows detected below 0.002% 11) 05/28/2021: BCR/ABL PCR shows 0.0094% 12) 02/25/2022-Present: BCR/ABL PCR shows undetectable levels  Interval History:  Allen Adams 58 y.o. male with medical history significant for CML presents for a follow up visit. The patient's last visit was on 12/08/2023. In the interim since the last visit he has been well with no major changes in his health.   On exam today Allen Adams reports he is doing well without any changes to his health.  His energy and appetite are overall stable.  He is tolerating Sprycel  therapy without any new or concerning side effects.  He denies nausea, vomiting or bowel habit changes.  He denies easy bruising or signs of active bleeding including hematochezia or melena.  He denies fevers, chills, night sweats, shortness of breath, chest pain or cough.  He has no other complaints.  A full 10 point ROS is listed below.   MEDICAL HISTORY:  Past Medical History:  Diagnosis Date   Anxiety    Atrial fibrillation (HCC)    Hypertriglyceridemia     OSA (obstructive sleep apnea)    (SPLIT 03/05/11, ESS 6, AHI 41/hr REM 90/hr, RDI 83/hr REM 120/hr, O2 nadir 83%; CPAP 9 with AHI 0/hr), Dr Cherlyn Cornet    SURGICAL HISTORY: Past Surgical History:  Procedure Laterality Date   Lasik like eye surgery, PPK     lumbar back surgery      SOCIAL HISTORY: Social History   Socioeconomic History   Marital status: Married    Spouse name: Not on file   Number of children: Not on file   Years of education: Not on file   Highest education level: Not on file  Occupational History   Not on file  Tobacco Use   Smoking status: Never   Smokeless tobacco: Never  Substance and Sexual Activity   Alcohol use: Not on file   Drug use: Not on file   Sexual activity: Not on file  Other Topics Concern   Not on file  Social History Narrative   Not on file   Social Drivers of Health   Financial Resource Strain: Not on file  Food Insecurity: Not on file  Transportation Needs: Not on file  Physical Activity: Not on file  Stress: Not on file (08/31/2023)  Social Connections: Not on file  Intimate Partner Violence: Low Risk  (05/12/2020)   Received from [facility]   Intimate Partner Violence    Insults You: Not on file    Threatens You: Not on file    Screams at You: Not on file    Physically Hurt: Not on file    Intimate Partner Violence Score: Not on file  FAMILY HISTORY: Family History  Problem Relation Age of Onset   Stroke Mother    Cancer Father    Heart attack Maternal Grandfather     ALLERGIES:  has no known allergies.  MEDICATIONS:  Current Outpatient Medications  Medication Sig Dispense Refill   aspirin 81 MG tablet Take 81 mg by mouth daily.     dasatinib  (SPRYCEL ) 100 MG tablet Take 1 tablet (100 mg total) by mouth daily. 90 tablet 2   No current facility-administered medications for this visit.    REVIEW OF SYSTEMS:   Constitutional: ( - ) fevers, ( - )  chills , ( - ) night sweats Eyes: ( - )  blurriness of vision, ( - ) double vision, ( - ) watery eyes Ears, nose, mouth, throat, and face: ( - ) mucositis, ( - ) sore throat Respiratory: ( - ) cough, ( - ) dyspnea, ( - ) wheezes Cardiovascular: ( - ) palpitation, ( - ) chest discomfort, ( - ) lower extremity swelling Gastrointestinal:  ( - ) nausea, ( - ) heartburn, ( - ) change in bowel habits Skin: ( - ) abnormal skin rashes Lymphatics: ( - ) new lymphadenopathy, ( - ) easy bruising Neurological: ( - ) numbness, ( - ) tingling, ( - ) new weaknesses Behavioral/Psych: ( - ) mood change, ( - ) new changes  All other systems were reviewed with the patient and are negative.  PHYSICAL EXAMINATION: ECOG PERFORMANCE STATUS: 0 - Asymptomatic  Vitals:   03/09/24 0844  BP: (!) 141/72  Pulse: 94  Resp: 17  Temp: 98.1 F (36.7 C)  SpO2: 100%   There were no vitals filed for this visit.    GENERAL: well appearing middle aged Caucasian male. Alert, no distress and comfortable SKIN: skin color, texture, turgor are normal, no rashes or significant lesions.  EYES: conjunctiva are pink and non-injected, sclera clear LUNGS: clear to auscultation and percussion with normal breathing effort HEART: regular rate & rhythm and no murmurs and no lower extremity edema Musculoskeletal: no cyanosis of digits and no clubbing.  PSYCH: alert & oriented x 3, fluent speech NEURO: no focal motor/sensory deficits  LABORATORY DATA:  I have reviewed the data as listed    Latest Ref Rng & Units 03/09/2024    8:14 AM 12/08/2023    8:03 AM 09/08/2023    8:08 AM  CBC  WBC 4.0 - 10.5 K/uL 5.1  5.0  5.5   Hemoglobin 13.0 - 17.0 g/dL 16.1  09.6  04.5   Hematocrit 39.0 - 52.0 % 44.7  44.4  44.0   Platelets 150 - 400 K/uL 208  197  153        Latest Ref Rng & Units 03/09/2024    8:14 AM 12/08/2023    8:03 AM 09/08/2023    8:08 AM  CMP  Glucose 70 - 99 mg/dL 409  811  914   BUN 6 - 20 mg/dL 19  20  20    Creatinine 0.61 - 1.24 mg/dL 7.82  9.56  2.13    Sodium 135 - 145 mmol/L 141  140  140   Potassium 3.5 - 5.1 mmol/L 4.0  3.8  4.1   Chloride 98 - 111 mmol/L 107  107  106   CO2 22 - 32 mmol/L 26  26  27    Calcium 8.9 - 10.3 mg/dL 9.0  8.7  8.8   Total Protein 6.5 - 8.1 g/dL 6.9  6.7  6.8  Total Bilirubin 0.0 - 1.2 mg/dL 0.5  0.6  0.6   Alkaline Phos 38 - 126 U/L 61  62  57   AST 15 - 41 U/L 17  16  21    ALT 0 - 44 U/L 16  14  18      RADIOGRAPHIC STUDIES: No results found.  ASSESSMENT & PLAN Allen Adams 58 y.o. male with medical history significant for CML presents for a follow up visit.    #CML, Chronic Phase --after 3 years of treatment (May 2024), patient requested to continue Dasatinib  therapy.  This is a reasonable choice as approximately 50% of patients relapse after discontinuation of the medication and require restarting and indefinite treatment anyway. --Labs today show white blood cell count 5.1, Hgb 14.8, MCV 89.4, Plt 208. Creatinine mildly elevated at 1.30 but overall stable. LFTs normal. Awaiting BCR/ABL PCR to evaluate for molecular response.  Previous results showed undetectable levels of BCR-ABL --Continue Dasatinib  100 mg PO daily without any dose modifications. Refill sent today.  --RTC in 12 weeks for labs and clinic visit    No orders of the defined types were placed in this encounter.  All questions were answered. The patient knows to call the clinic with any problems, questions or concerns.  I have spent a total of 25 minutes minutes of face-to-face and non-face-to-face time, preparing to see the patient, performing a medically appropriate examination, counseling and educating the patient, ordering medications, documenting clinical information in the electronic health record, independently interpreting results and communicating results to the patient, and care coordination.   Wyline Hearing PA-C Dept of Hematology and Oncology [facility] at [facility] Phone:  [phone]   03/09/2024 9:11 AM

## 2024-03-12 DIAGNOSIS — G4733 Obstructive sleep apnea (adult) (pediatric): Secondary | ICD-10-CM | POA: Diagnosis not present

## 2024-03-14 LAB — BCR-ABL1, CML/ALL, PCR, QUANT
E1A2 Transcript: <0.0032 %
Interpretation (BCRAL):: NEGATIVE
b2a2 transcript: <0.0032 %
b3a2 transcript: <0.0032 %

## 2024-03-16 ENCOUNTER — Ambulatory Visit: Payer: Self-pay | Admitting: *Deleted

## 2024-03-16 NOTE — Telephone Encounter (Signed)
-----   Message from Rogerio Clay IV sent at 03/14/2024  8:47 AM EDT ----- Please let Mr. Goble know that his blood counts look great and his CML levels remain undetectable. ----- Message ----- From: Interface, Lab In [place] Sent: 03/09/2024   8:25 AM EDT To: Ander Bame, MD

## 2024-03-16 NOTE — Telephone Encounter (Signed)
 TCT patient regarding recent lab results.  Spoke with his wife.  Advised  that his blood counts look great and his CML levels remain undetectable. She voiced understanding.

## 2024-04-06 DIAGNOSIS — R972 Elevated prostate specific antigen [PSA]: Secondary | ICD-10-CM | POA: Diagnosis not present

## 2024-04-06 DIAGNOSIS — Z79899 Other long term (current) drug therapy: Secondary | ICD-10-CM | POA: Diagnosis not present

## 2024-04-06 DIAGNOSIS — N401 Enlarged prostate with lower urinary tract symptoms: Secondary | ICD-10-CM | POA: Diagnosis not present

## 2024-04-10 DIAGNOSIS — G4733 Obstructive sleep apnea (adult) (pediatric): Secondary | ICD-10-CM | POA: Diagnosis not present

## 2024-04-12 DIAGNOSIS — G4733 Obstructive sleep apnea (adult) (pediatric): Secondary | ICD-10-CM | POA: Diagnosis not present

## 2024-04-16 DIAGNOSIS — G4733 Obstructive sleep apnea (adult) (pediatric): Secondary | ICD-10-CM | POA: Diagnosis not present

## 2024-05-07 DIAGNOSIS — G4733 Obstructive sleep apnea (adult) (pediatric): Secondary | ICD-10-CM | POA: Diagnosis not present

## 2024-05-08 DIAGNOSIS — H2511 Age-related nuclear cataract, right eye: Secondary | ICD-10-CM | POA: Diagnosis not present

## 2024-05-08 DIAGNOSIS — H18511 Endothelial corneal dystrophy, right eye: Secondary | ICD-10-CM | POA: Diagnosis not present

## 2024-05-10 DIAGNOSIS — G4733 Obstructive sleep apnea (adult) (pediatric): Secondary | ICD-10-CM | POA: Diagnosis not present

## 2024-05-17 DIAGNOSIS — N183 Chronic kidney disease, stage 3 unspecified: Secondary | ICD-10-CM | POA: Diagnosis not present

## 2024-05-17 DIAGNOSIS — H2511 Age-related nuclear cataract, right eye: Secondary | ICD-10-CM | POA: Diagnosis not present

## 2024-05-17 DIAGNOSIS — C9210 Chronic myeloid leukemia, BCR/ABL-positive, not having achieved remission: Secondary | ICD-10-CM | POA: Diagnosis not present

## 2024-05-17 DIAGNOSIS — H18511 Endothelial corneal dystrophy, right eye: Secondary | ICD-10-CM | POA: Diagnosis not present

## 2024-05-17 DIAGNOSIS — G4733 Obstructive sleep apnea (adult) (pediatric): Secondary | ICD-10-CM | POA: Diagnosis not present

## 2024-05-18 DIAGNOSIS — Z9841 Cataract extraction status, right eye: Secondary | ICD-10-CM | POA: Diagnosis not present

## 2024-05-18 DIAGNOSIS — H18512 Endothelial corneal dystrophy, left eye: Secondary | ICD-10-CM | POA: Diagnosis not present

## 2024-05-18 DIAGNOSIS — Z947 Corneal transplant status: Secondary | ICD-10-CM | POA: Diagnosis not present

## 2024-05-18 DIAGNOSIS — H2512 Age-related nuclear cataract, left eye: Secondary | ICD-10-CM | POA: Diagnosis not present

## 2024-05-21 DIAGNOSIS — H18512 Endothelial corneal dystrophy, left eye: Secondary | ICD-10-CM | POA: Diagnosis not present

## 2024-05-21 DIAGNOSIS — H2512 Age-related nuclear cataract, left eye: Secondary | ICD-10-CM | POA: Diagnosis not present

## 2024-05-21 DIAGNOSIS — Z947 Corneal transplant status: Secondary | ICD-10-CM | POA: Diagnosis not present

## 2024-05-21 DIAGNOSIS — Z9841 Cataract extraction status, right eye: Secondary | ICD-10-CM | POA: Diagnosis not present

## 2024-05-25 DIAGNOSIS — Z9841 Cataract extraction status, right eye: Secondary | ICD-10-CM | POA: Diagnosis not present

## 2024-05-25 DIAGNOSIS — H2512 Age-related nuclear cataract, left eye: Secondary | ICD-10-CM | POA: Diagnosis not present

## 2024-05-25 DIAGNOSIS — H18512 Endothelial corneal dystrophy, left eye: Secondary | ICD-10-CM | POA: Diagnosis not present

## 2024-05-25 DIAGNOSIS — Z947 Corneal transplant status: Secondary | ICD-10-CM | POA: Diagnosis not present

## 2024-06-07 ENCOUNTER — Inpatient Hospital Stay (HOSPITAL_BASED_OUTPATIENT_CLINIC_OR_DEPARTMENT_OTHER): Payer: BC Managed Care – PPO | Admitting: Hematology and Oncology

## 2024-06-07 ENCOUNTER — Inpatient Hospital Stay: Payer: BC Managed Care – PPO | Attending: Hematology and Oncology

## 2024-06-07 DIAGNOSIS — Z8249 Family history of ischemic heart disease and other diseases of the circulatory system: Secondary | ICD-10-CM | POA: Insufficient documentation

## 2024-06-07 DIAGNOSIS — Z823 Family history of stroke: Secondary | ICD-10-CM | POA: Insufficient documentation

## 2024-06-07 DIAGNOSIS — R7989 Other specified abnormal findings of blood chemistry: Secondary | ICD-10-CM | POA: Diagnosis not present

## 2024-06-07 DIAGNOSIS — E781 Pure hyperglyceridemia: Secondary | ICD-10-CM | POA: Insufficient documentation

## 2024-06-07 DIAGNOSIS — Z947 Corneal transplant status: Secondary | ICD-10-CM | POA: Diagnosis not present

## 2024-06-07 DIAGNOSIS — C9210 Chronic myeloid leukemia, BCR/ABL-positive, not having achieved remission: Secondary | ICD-10-CM | POA: Insufficient documentation

## 2024-06-07 DIAGNOSIS — Z79899 Other long term (current) drug therapy: Secondary | ICD-10-CM | POA: Insufficient documentation

## 2024-06-07 DIAGNOSIS — I4891 Unspecified atrial fibrillation: Secondary | ICD-10-CM | POA: Diagnosis not present

## 2024-06-07 DIAGNOSIS — Z809 Family history of malignant neoplasm, unspecified: Secondary | ICD-10-CM | POA: Diagnosis not present

## 2024-06-07 DIAGNOSIS — Z7982 Long term (current) use of aspirin: Secondary | ICD-10-CM | POA: Insufficient documentation

## 2024-06-07 LAB — CMP (CANCER CENTER ONLY)
ALT: 17 U/L (ref 0–44)
AST: 15 U/L (ref 15–41)
Albumin: 4.6 g/dL (ref 3.5–5.0)
Alkaline Phosphatase: 66 U/L (ref 38–126)
Anion gap: 9 (ref 5–15)
BUN: 18 mg/dL (ref 6–20)
CO2: 25 mmol/L (ref 22–32)
Calcium: 8.8 mg/dL — ABNORMAL LOW (ref 8.9–10.3)
Chloride: 107 mmol/L (ref 98–111)
Creatinine: 1.34 mg/dL — ABNORMAL HIGH (ref 0.61–1.24)
GFR, Estimated: >60 mL/min (ref >60)
Glucose, Bld: 100 mg/dL — ABNORMAL HIGH (ref 70–99)
Potassium: 4 mmol/L (ref 3.5–5.1)
Sodium: 141 mmol/L (ref 135–145)
Total Bilirubin: 0.5 mg/dL (ref 0.0–1.2)
Total Protein: 6.7 g/dL (ref 6.5–8.1)

## 2024-06-07 LAB — CBC WITH DIFFERENTIAL (CANCER CENTER ONLY)
Abs Immature Granulocytes: 0.02 K/uL (ref 0.00–0.07)
Basophils Absolute: 0.1 K/uL (ref 0.0–0.1)
Basophils Relative: 1 %
Eosinophils Absolute: 0.1 K/uL (ref 0.0–0.5)
Eosinophils Relative: 1 %
HCT: 44.2 % (ref 39.0–52.0)
Hemoglobin: 14.7 g/dL (ref 13.0–17.0)
Immature Granulocytes: 0 %
Lymphocytes Relative: 32 %
Lymphs Abs: 1.6 K/uL (ref 0.7–4.0)
MCH: 29.6 pg (ref 26.0–34.0)
MCHC: 33.3 g/dL (ref 30.0–36.0)
MCV: 89.1 fL (ref 80.0–100.0)
Monocytes Absolute: 0.5 K/uL (ref 0.1–1.0)
Monocytes Relative: 9 %
Neutro Abs: 2.7 K/uL (ref 1.7–7.7)
Neutrophils Relative %: 57 %
Platelet Count: 211 K/uL (ref 150–400)
RBC: 4.96 MIL/uL (ref 4.22–5.81)
RDW: 14.1 % (ref 11.5–15.5)
WBC Count: 4.9 K/uL (ref 4.0–10.5)
nRBC: 0 % (ref 0.0–0.2)

## 2024-06-07 MED ORDER — DASATINIB 100 MG PO TABS: 100.0000 mg | ORAL_TABLET | Freq: Every day | ORAL | 2 refills | Status: DC

## 2024-06-07 NOTE — Progress Notes (Signed)
 [facility] [phone]   [phone]  PROGRESS NOTE  Patient Care Team: Teresa Channel, MD as PCP - General (Family Medicine)  Hematological/Oncological History # Chronic Myeloid Leukemia, Chronic Phase 1) 06/04/2016: WBC count 15.5, ANC 12.9 2) 01/17/2020: WBC 18.9, Hgb 16.2, Plt 537, MCV 86.7 3) 02/01/2020: establish care with Dr. Gudena. Flow cytometry showed no abnormalities. JAK2 negative. BCR/ABL PCR showed 90.67% positive nuclei for the fusion. Diagnosed with CML 4) 02/21/2020: establish care with Dr. Federico 5) 03/05/2020: Bone marrow biopsy performed, findings consistent with CML. Started Dasatinib 100mg  PO daily.  6) 04/03/2020: complete hematological response with normalization of blood counts.  7) 05/30/2020: BCR/ABL PCR shows undetectable levels 8) 08/29/2020: BCR/ABL PCR shows undetectable levels 9) 11/27/2020: BCR/ABL PCR shows 0.0059% 10) 02/26/2021: BCR/ABL PCR shows detected below 0.002% 11) 05/28/2021: BCR/ABL PCR shows 0.0094% 12) 02/25/2022-Present: BCR/ABL PCR shows undetectable levels  Interval History:  Allen Adams 58 y.o. male with medical history significant for CML presents for a follow up visit. The patient's last visit was on 03/09/2024. In the interim since the last visit he has been well with no major changes in his health.   On exam today Allen Adams reports he has been well overall in the neuro since her last visit 3 months ago.  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  He reports he is currently having some issues with congestion and sinus drainage but no fevers, chills, sweats.  He reports it is allergy versus virus and is mild.  He reports his energy and appetite levels are good.  He reports that he is taking his Sprycel with no major side effects.  He is not having any shortness of breath or cough.  He reports that he will be staying local this summer and does not have any plan to  travel.  He is doing his best to try to drink plenty of water.  Otherwise he has no questions concerns or complaints today.  A full 10 point ROS is otherwise negative.   MEDICAL HISTORY:  Past Medical History:  Diagnosis Date   Anxiety    Atrial fibrillation (HCC)    Hypertriglyceridemia    OSA (obstructive sleep apnea)    (SPLIT 03/05/11, ESS 6, AHI 41/hr REM 90/hr, RDI 83/hr REM 120/hr, O2 nadir 83%; CPAP 9 with AHI 0/hr), Dr Tammy    SURGICAL HISTORY: Past Surgical History:  Procedure Laterality Date   Lasik like eye surgery, PPK     lumbar back surgery      SOCIAL HISTORY: Social History   Socioeconomic History   Marital status: Married    Spouse name: Not on file   Number of children: Not on file   Years of education: Not on file   Highest education level: Not on file  Occupational History   Not on file  Tobacco Use   Smoking status: Never   Smokeless tobacco: Never  Substance and Sexual Activity   Alcohol use: Not on file   Drug use: Not on file   Sexual activity: Not on file  Other Topics Concern   Not on file  Social History Narrative   Not on file   Social Drivers of Health   Financial Resource Strain: Not on file  Food Insecurity: Not on file  Transportation Needs: Not on file  Physical Activity: Not on file  Stress: Not on file (08/31/2023)  Social Connections: Not on file  Intimate Partner Violence: Low Risk  (05/12/2020)   Received [facility]   Intimate Partner Violence    Insults You: Not on file    Threatens You: Not on file    Screams at You: Not on file    Physically Hurt: Not on file    Intimate Partner Violence Score: Not on file    FAMILY HISTORY: Family History  Problem Relation Age of Onset   Stroke Mother    Cancer Father    Heart attack Maternal Grandfather     ALLERGIES:  has no known allergies.  MEDICATIONS:  Current Outpatient Medications  Medication Sig Dispense Refill   aspirin 81 MG tablet Take 81 mg by  mouth daily.     dasatinib (SPRYCEL) 100 MG tablet Take 1 tablet (100 mg total) by mouth daily. 90 tablet 2   No current facility-administered medications for this visit.    REVIEW OF SYSTEMS:   Constitutional: ( - ) fevers, ( - )  chills , ( - ) night sweats Eyes: ( - ) blurriness of vision, ( - ) double vision, ( - ) watery eyes Ears, nose, mouth, throat, and face: ( - ) mucositis, ( - ) sore throat Respiratory: ( - ) cough, ( - ) dyspnea, ( - ) wheezes Cardiovascular: ( - ) palpitation, ( - ) chest discomfort, ( - ) lower extremity swelling Gastrointestinal:  ( - ) nausea, ( - ) heartburn, ( - ) change in bowel habits Skin: ( - ) abnormal skin rashes Lymphatics: ( - ) new lymphadenopathy, ( - ) easy bruising Neurological: ( - ) numbness, ( - ) tingling, ( - ) new weaknesses Behavioral/Psych: ( - ) mood change, ( - ) new changes  All other systems were reviewed with the patient and are negative.  PHYSICAL EXAMINATION: ECOG PERFORMANCE STATUS: 0 - Asymptomatic  There were no vitals filed for this visit.  There were no vitals filed for this visit.    GENERAL: well appearing middle aged Caucasian male. Alert, no distress and comfortable SKIN: skin color, texture, turgor are normal, no rashes or significant lesions.  EYES: conjunctiva are pink and non-injected, sclera clear LUNGS: clear to auscultation and percussion with normal breathing effort HEART: regular rate & rhythm and no murmurs and no lower extremity edema Musculoskeletal: no cyanosis of digits and no clubbing.  PSYCH: alert & oriented x 3, fluent speech NEURO: no focal motor/sensory deficits  LABORATORY DATA:  I have reviewed the data as listed    Latest Ref Rng & Units 03/09/2024    8:14 AM 12/08/2023    8:03 AM 09/08/2023    8:08 AM  CBC  WBC 4.0 - 10.5 K/uL 5.1  5.0  5.5   Hemoglobin 13.0 - 17.0 g/dL 85.1  85.3  85.2   Hematocrit 39.0 - 52.0 % 44.7  44.4  44.0   Platelets 150 - 400 K/uL 208  197  153         Latest Ref Rng & Units 03/09/2024    8:14 AM 12/08/2023    8:03 AM 09/08/2023    8:08 AM  CMP  Glucose 70 - 99 mg/dL 893  899  878   BUN 6 - 20 mg/dL 19  20  20    Creatinine 0.61 - 1.24 mg/dL 8.69  8.63  8.52   Sodium 135 - 145 mmol/L 141  140  140   Potassium 3.5 - 5.1 mmol/L 4.0  3.8  4.1   Chloride  98 - 111 mmol/L 107  107  106   CO2 22 - 32 mmol/L 26  26  27    Calcium 8.9 - 10.3 mg/dL 9.0  8.7  8.8   Total Protein 6.5 - 8.1 g/dL 6.9  6.7  6.8   Total Bilirubin 0.0 - 1.2 mg/dL 0.5  0.6  0.6   Alkaline Phos 38 - 126 U/L 61  62  57   AST 15 - 41 U/L 17  16  21    ALT 0 - 44 U/L 16  14  18      RADIOGRAPHIC STUDIES: No results found.  ASSESSMENT & PLAN MAXIMILIEN HAYASHI 58 y.o. male with medical history significant for CML presents for a follow up visit.    #CML, Chronic Phase --after 3 years of treatment (May 2024), patient requested to continue Dasatinib therapy.  This is a reasonable choice as approximately 50% of patients relapse after discontinuation of the medication and require restarting and indefinite treatment anyway. --Labs today show white blood cell count 4.9, hemoglobin 14.7, MCV 89.1, platelets 211. Creatinine mildly elevated at 1.34 but overall stable. LFTs normal. Awaiting BCR/ABL PCR to evaluate for molecular response.  Previous results showed undetectable levels of BCR-ABL --Continue Dasatinib 100 mg PO daily without any dose modifications. Refill sent today.  --RTC in 12 weeks for labs and clinic visit    No orders of the defined types were placed in this encounter.  All questions were answered. The patient knows to call the clinic with any problems, questions or concerns.  I have spent a total of 25 minutes minutes of face-to-face and non-face-to-face time, preparing to see the patient, performing a medically appropriate examination, counseling and educating the patient, ordering medications, documenting clinical information in the electronic health  record, independently interpreting results and communicating results to the patient, and care coordination.   Norleen IVAR Kidney, MD Department of Hematology/Oncology [facility] Phone: [phone] Pager: [phone] Email: [email or web address]    06/07/2024 7:06 AM

## 2024-06-10 DIAGNOSIS — G4733 Obstructive sleep apnea (adult) (pediatric): Secondary | ICD-10-CM | POA: Diagnosis not present

## 2024-06-12 LAB — BCR-ABL1, CML/ALL, PCR, QUANT
E1A2 Transcript: <0.0032 %
Interpretation (BCRAL):: NEGATIVE
b2a2 transcript: <0.0032 %
b3a2 transcript: <0.0032 %

## 2024-06-13 DIAGNOSIS — Z947 Corneal transplant status: Secondary | ICD-10-CM | POA: Diagnosis not present

## 2024-06-13 DIAGNOSIS — H2512 Age-related nuclear cataract, left eye: Secondary | ICD-10-CM | POA: Diagnosis not present

## 2024-06-13 DIAGNOSIS — Z9841 Cataract extraction status, right eye: Secondary | ICD-10-CM | POA: Diagnosis not present

## 2024-06-13 DIAGNOSIS — H18512 Endothelial corneal dystrophy, left eye: Secondary | ICD-10-CM | POA: Diagnosis not present

## 2024-06-21 DIAGNOSIS — R972 Elevated prostate specific antigen [PSA]: Secondary | ICD-10-CM | POA: Diagnosis not present

## 2024-06-21 DIAGNOSIS — N401 Enlarged prostate with lower urinary tract symptoms: Secondary | ICD-10-CM | POA: Diagnosis not present

## 2024-06-22 ENCOUNTER — Ambulatory Visit: Payer: Self-pay | Admitting: *Deleted

## 2024-06-22 NOTE — Telephone Encounter (Signed)
 TCT patient regarding recent lab results. Spoke with Allen Adams. Advised that his blood work showed his CML levels remain undetectable. We will plan to see him back as scheduled in November 2025. He is pleased with results and is aware of his appts in November

## 2024-06-22 NOTE — Telephone Encounter (Signed)
-----   Message from Norleen ONEIDA Kidney IV sent at 06/20/2024  2:03 PM EDT ----- Please let Allen Adams know that his blood work showed his CML levels remain undetectable.  We will plan to see him back as scheduled in November 2025. ----- Message ----- From: Rebecka, Lab In [place] Sent: 06/07/2024   8:17 AM EDT To: Norleen ONEIDA Kidney MADISON, MD

## 2024-07-25 DIAGNOSIS — H2512 Age-related nuclear cataract, left eye: Secondary | ICD-10-CM | POA: Diagnosis not present

## 2024-07-25 DIAGNOSIS — H18512 Endothelial corneal dystrophy, left eye: Secondary | ICD-10-CM | POA: Diagnosis not present

## 2024-07-25 DIAGNOSIS — Z9841 Cataract extraction status, right eye: Secondary | ICD-10-CM | POA: Diagnosis not present

## 2024-07-25 DIAGNOSIS — Z947 Corneal transplant status: Secondary | ICD-10-CM | POA: Diagnosis not present

## 2024-08-08 DIAGNOSIS — G4733 Obstructive sleep apnea (adult) (pediatric): Secondary | ICD-10-CM | POA: Diagnosis not present

## 2024-09-06 ENCOUNTER — Inpatient Hospital Stay: Payer: BC Managed Care – PPO | Admitting: Hematology and Oncology

## 2024-09-06 ENCOUNTER — Inpatient Hospital Stay: Payer: BC Managed Care – PPO | Attending: Hematology and Oncology

## 2024-09-06 DIAGNOSIS — Z809 Family history of malignant neoplasm, unspecified: Secondary | ICD-10-CM | POA: Diagnosis not present

## 2024-09-06 DIAGNOSIS — Z7982 Long term (current) use of aspirin: Secondary | ICD-10-CM | POA: Insufficient documentation

## 2024-09-06 DIAGNOSIS — Z8249 Family history of ischemic heart disease and other diseases of the circulatory system: Secondary | ICD-10-CM | POA: Diagnosis not present

## 2024-09-06 DIAGNOSIS — C9210 Chronic myeloid leukemia, BCR/ABL-positive, not having achieved remission: Secondary | ICD-10-CM | POA: Diagnosis not present

## 2024-09-06 DIAGNOSIS — Z823 Family history of stroke: Secondary | ICD-10-CM | POA: Diagnosis not present

## 2024-09-06 DIAGNOSIS — Z79899 Other long term (current) drug therapy: Secondary | ICD-10-CM | POA: Insufficient documentation

## 2024-09-06 DIAGNOSIS — R7989 Other specified abnormal findings of blood chemistry: Secondary | ICD-10-CM | POA: Diagnosis not present

## 2024-09-06 LAB — CMP (CANCER CENTER ONLY)
ALT: 20 U/L (ref 0–44)
AST: 17 U/L (ref 15–41)
Albumin: 4.5 g/dL (ref 3.5–5.0)
Alkaline Phosphatase: 66 U/L (ref 38–126)
Anion gap: 8 (ref 5–15)
BUN: 23 mg/dL — ABNORMAL HIGH (ref 6–20)
CO2: 24 mmol/L (ref 22–32)
Calcium: 8.9 mg/dL (ref 8.9–10.3)
Chloride: 108 mmol/L (ref 98–111)
Creatinine: 1.37 mg/dL — ABNORMAL HIGH (ref 0.61–1.24)
GFR, Estimated: 60 mL/min — ABNORMAL LOW (ref >60)
Glucose, Bld: 104 mg/dL — ABNORMAL HIGH (ref 70–99)
Potassium: 4.1 mmol/L (ref 3.5–5.1)
Sodium: 140 mmol/L (ref 135–145)
Total Bilirubin: 0.6 mg/dL (ref 0.0–1.2)
Total Protein: 6.7 g/dL (ref 6.5–8.1)

## 2024-09-06 LAB — CBC WITH DIFFERENTIAL (CANCER CENTER ONLY)
Abs Immature Granulocytes: 0.03 K/uL (ref 0.00–0.07)
Basophils Absolute: 0.1 K/uL (ref 0.0–0.1)
Basophils Relative: 1 %
Eosinophils Absolute: 0.1 K/uL (ref 0.0–0.5)
Eosinophils Relative: 2 %
HCT: 45.4 % (ref 39.0–52.0)
Hemoglobin: 15 g/dL (ref 13.0–17.0)
Immature Granulocytes: 1 %
Lymphocytes Relative: 35 %
Lymphs Abs: 1.7 K/uL (ref 0.7–4.0)
MCH: 29.5 pg (ref 26.0–34.0)
MCHC: 33 g/dL (ref 30.0–36.0)
MCV: 89.4 fL (ref 80.0–100.0)
Monocytes Absolute: 0.5 K/uL (ref 0.1–1.0)
Monocytes Relative: 11 %
Neutro Abs: 2.4 K/uL (ref 1.7–7.7)
Neutrophils Relative %: 50 %
Platelet Count: 183 K/uL (ref 150–400)
RBC: 5.08 MIL/uL (ref 4.22–5.81)
RDW: 14.6 % (ref 11.5–15.5)
WBC Count: 4.8 K/uL (ref 4.0–10.5)
nRBC: 0 % (ref 0.0–0.2)

## 2024-09-06 MED ORDER — DASATINIB 100 MG PO TABS: 100.0000 mg | ORAL_TABLET | Freq: Every day | ORAL | 2 refills | Status: AC
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
## 2024-09-13 LAB — BCR-ABL1, CML/ALL, PCR, QUANT
E1A2 Transcript: <0.0032 %
Interpretation (BCRAL):: NEGATIVE
b2a2 transcript: <0.0032 %
b3a2 transcript: <0.0032 %

## 2024-09-19 ENCOUNTER — Ambulatory Visit: Payer: Self-pay | Admitting: *Deleted

## 2024-09-19 NOTE — Telephone Encounter (Signed)
-----   Message from [place] ONEIDA Police sent at 09/17/2024  1:31 PM EST ----- Please let Mr. Ecklund know his CML levels remain undetectable.  ----- Message ----- From: Federico Norleen ONEIDA MADISON, MD Sent: 09/13/2024   9:58 PM EST To: [place] ONEIDA Police, PA-C  Please let Mr. Platter know his CML levels remain undetectable.  ----- Message ----- From: Rebecka, Lab In [place] Sent: 09/06/2024   7:58 AM EST To: Norleen ONEIDA Federico MADISON, MD

## 2024-09-19 NOTE — Telephone Encounter (Signed)
 TCT patient regarding recent lab results. Advised that his CML levels remain undetectable. He voiced understanding.

## 2024-10-08 DIAGNOSIS — G4733 Obstructive sleep apnea (adult) (pediatric): Secondary | ICD-10-CM | POA: Diagnosis not present

## 2024-12-13 ENCOUNTER — Inpatient Hospital Stay: Payer: BC Managed Care – PPO | Attending: Hematology and Oncology

## 2024-12-13 ENCOUNTER — Inpatient Hospital Stay: Payer: BC Managed Care – PPO | Admitting: Hematology and Oncology
